# Patient Record
Sex: Female | Born: 1980 | Race: Asian | Hispanic: No | Marital: Single | Smoking: Current some day smoker
Health system: Southern US, Community
[De-identification: ages and names within clinical notes are randomized; demographics above are authoritative.]

## PROBLEM LIST (undated history)

## (undated) DIAGNOSIS — I10 Essential (primary) hypertension: Secondary | ICD-10-CM

## (undated) DIAGNOSIS — E785 Hyperlipidemia, unspecified: Secondary | ICD-10-CM

## (undated) HISTORY — PX: BREAST SURGERY: SHX581

## (undated) HISTORY — DX: Hyperlipidemia, unspecified: E78.5

## (undated) HISTORY — DX: Essential (primary) hypertension: I10

---

## 2009-05-28 ENCOUNTER — Emergency Department (HOSPITAL_COMMUNITY): Admission: EM | Admit: 2009-05-28 | Discharge: 2009-05-28 | Payer: Self-pay | Admitting: Emergency Medicine

## 2010-07-18 LAB — POCT I-STAT, CHEM 8
Hemoglobin: 15 g/dL (ref 12.0–15.0)
Sodium: 140 mEq/L (ref 135–145)
TCO2: 23 mmol/L (ref 0–100)

## 2010-07-18 LAB — CBC
HCT: 42.5 % (ref 36.0–46.0)
MCHC: 32.7 g/dL (ref 30.0–36.0)
MCV: 84.8 fL (ref 78.0–100.0)
Platelets: 328 10*3/uL (ref 150–400)
RDW: 13.3 % (ref 11.5–15.5)

## 2010-07-18 LAB — DIFFERENTIAL
Basophils Relative: 0 % (ref 0–1)
Eosinophils Absolute: 0 10*3/uL (ref 0.0–0.7)
Eosinophils Relative: 1 % (ref 0–5)
Neutrophils Relative %: 63 % (ref 43–77)

## 2010-07-18 LAB — POCT CARDIAC MARKERS: Myoglobin, poc: 55.9 ng/mL (ref 12–200)

## 2014-05-01 HISTORY — PX: AUGMENTATION MAMMAPLASTY: SUR837

## 2015-11-23 ENCOUNTER — Ambulatory Visit (INDEPENDENT_AMBULATORY_CARE_PROVIDER_SITE_OTHER): Payer: BLUE CROSS/BLUE SHIELD | Admitting: Physician Assistant

## 2015-11-23 VITALS — BP 122/72 | HR 97 | Temp 99.2°F | Resp 17 | Ht 66.0 in | Wt 179.0 lb

## 2015-11-23 DIAGNOSIS — N946 Dysmenorrhea, unspecified: Secondary | ICD-10-CM | POA: Diagnosis not present

## 2015-11-23 DIAGNOSIS — N939 Abnormal uterine and vaginal bleeding, unspecified: Secondary | ICD-10-CM | POA: Diagnosis not present

## 2015-11-23 LAB — POCT WET + KOH PREP
Trich by wet prep: ABSENT
YEAST BY WET PREP: ABSENT
Yeast by KOH: ABSENT

## 2015-11-23 LAB — POCT URINE PREGNANCY: PREG TEST UR: NEGATIVE

## 2015-11-23 MED ORDER — TRAMADOL HCL 50 MG PO TABS: 50.0000 mg | ORAL_TABLET | Freq: Three times a day (TID) | ORAL | 0 refills | Status: DC | PRN

## 2015-11-23 MED ORDER — NAPROXEN 500 MG PO TABS: 500.0000 mg | ORAL_TABLET | Freq: Two times a day (BID) | ORAL | 0 refills | Status: DC

## 2015-11-23 NOTE — Patient Instructions (Signed)
     IF you received an x-ray today, you will receive an invoice from [facility]. Please contact [facility] at [phone] with questions or concerns regarding your invoice.   IF you received labwork today, you will receive an invoice from [facility]. Please contact [facility] at [phone] with questions or concerns regarding your invoice.   Our billing staff will not be able to assist you with questions regarding bills from these companies.  You will be contacted with the lab results as soon as they are available. The fastest way to get your results is to activate your My Chart account. Instructions are located on the last page of this paperwork. If you have not heard from us regarding the results in 2 weeks, please contact this office.      

## 2015-11-23 NOTE — Progress Notes (Signed)
11/24/2015 9:29 AM   DOB: 1981/01/02 / MRN: [id number]  SUBJECTIVE:  Jordan Berger is a 35 y.o. female presenting for abnormal uterine bleeding and lower abdominal cramping that started after taking a Plan B roughly 1 month ago.  States she had some heavy bleeding just after taking plan B, then the bleeding lightened and she started taking [place] OCPs which her friend states was "high dose." The do not know the name of the medication.  Reports she continues to have bleeding and is going through 4 tampons daily.  She denies global symptoms of anemia and blood loss today.  She has GYN follow up in place for next Thursday. She has remained sexually active with this female since the onset of the bleeding.  He is a new partner for her.   She has No Known Allergies.   She  has a past medical history of Hyperlipidemia and Hypertension.    She  reports that she has been smoking Cigarettes.  She has a 1.25 pack-year smoking history. She has never used smokeless tobacco. She reports that she drinks alcohol. She  reports that she does not engage in sexual activity. The patient  has a past surgical history that includes Breast surgery.  Her family history includes Cancer in her mother; Heart disease in her father.  Review of Systems  Constitutional: Negative for chills and fever.  Gastrointestinal: Negative for constipation and diarrhea.  Genitourinary: Negative for dysuria, frequency and urgency.  Skin: Negative for itching and rash.  Neurological: Negative for dizziness and headaches.    Problem list and medications reviewed and updated by myself where necessary, and exist elsewhere in the encounter.   OBJECTIVE:  BP 122/72 (BP Location: Right Arm, Patient Position: Sitting, Cuff Size: Normal)   Pulse 97   Temp 99.2 F (37.3 C) (Oral)   Resp 17   Ht 5\' 6"  (1.676 m)   Wt 179 lb (81.2 kg)   LMP 11/23/2015 (Approximate)   SpO2 100%   BMI 28.89 kg/m   Physical Exam  Constitutional: She  is oriented to person, place, and time.  HENT:  Right Ear: External ear normal.  Left Ear: External ear normal.  Nose: Mucosal edema present. Right sinus exhibits no maxillary sinus tenderness and no frontal sinus tenderness. Left sinus exhibits no maxillary sinus tenderness and no frontal sinus tenderness.  Mouth/Throat: Oropharynx is clear and moist. No oropharyngeal exudate.  Eyes: Conjunctivae are normal. Pupils are equal, round, and reactive to light.  Cardiovascular: Regular rhythm and normal heart sounds.   Pulmonary/Chest: Effort normal and breath sounds normal. She has no rales.  Genitourinary: Uterus normal.    Cervix exhibits no motion tenderness, no discharge and no friability. Right adnexum displays no mass and no fullness. Left adnexum displays no mass and no fullness.    Neurological: She is alert and oriented to person, place, and time.  Skin: Skin is warm and dry. No rash noted. She is not diaphoretic. No erythema.  Psychiatric: Her behavior is normal.    Results for orders placed or performed in visit on 11/23/15 (from the past 72 hour(s))  POCT Wet + KOH Prep     Status: None   Collection Time: 11/23/15  6:25 PM  Result Value Ref Range   Yeast by KOH Absent Present, Absent   Yeast by wet prep Absent Present, Absent   WBC by wet prep None None, Few, Too numerous to count   Clue Cells Wet Prep HPF POC None None,  Too numerous to count   Trich by wet prep Absent Present, Absent   Bacteria Wet Prep HPF POC Few None, Few, Too numerous to count   Epithelial Cells By [employer] ([facility]) Few None, Few, Too numerous to count   RBC,UR,HPF,POC None None RBC/hpf  POCT urine pregnancy     Status: None   Collection Time: 11/23/15  6:25 PM  Result Value Ref Range   Preg Test, Ur Negative Negative    No results found.  ASSESSMENT AND PLAN  Bradee was seen today for abdominal pain.  Diagnoses and all orders for this visit:  Abnormal uterine bleeding (AUB): She has GYN  follow up place next in roughly 10 days.  Her cervix appears normal to me and there is no blood in the vaginal vault.  -     Cancel: GC/chlamydia probe amp, genital -     POCT Wet + KOH Prep -     POCT urine pregnancy -     Cancel: CBC -     CBC -     GC/Chlamydia Probe Amp  Menstrual cramps -     naproxen (NAPROSYN) 500 MG tablet; Take 1 tablet (500 mg total) by mouth 2 (two) times daily with a meal. -     traMADol (ULTRAM) 50 MG tablet; Take 1 tablet (50 mg total) by mouth every 8 (eight) hours as needed (For severe pain only.).    The patient was advised to call or return to clinic if she does not see an improvement in symptoms, or to seek the care of the closest emergency department if she worsens with the above plan.   Philis Fendt, MHS, PA-C [facility] 11/24/2015 9:29 AM

## 2015-11-24 ENCOUNTER — Telehealth: Payer: Self-pay

## 2015-11-24 LAB — CBC
HCT: 38.4 % (ref 35.0–45.0)
Hemoglobin: 12.2 g/dL (ref 11.7–15.5)
MCH: 26.7 pg — ABNORMAL LOW (ref 27.0–33.0)
MCHC: 31.8 g/dL — ABNORMAL LOW (ref 32.0–36.0)
MCV: 84 fL (ref 80.0–100.0)
MPV: 9.3 fL (ref 7.5–12.5)
PLATELETS: 413 10*3/uL — AB (ref 140–400)
RBC: 4.57 MIL/uL (ref 3.80–5.10)
RDW: 13.1 % (ref 11.0–15.0)
WBC: 8.8 10*3/uL (ref 3.8–10.8)

## 2015-11-24 NOTE — Telephone Encounter (Signed)
PATIENT SAW MICHAEL CLARK YESTERDAY AND HE GAVE HER 2 PRESCRIPTIONS. SHE ONLY GOT ONE. SHE RECEIVED THE NAPROSYN, BUT SHE DID NOT GET THE PRINTED PRESCRIPTION FOR TRAMADOL. ALSO, SOME ONE CALLED HER ABOUT 6:30 LAST NIGHT BUT THEY DID NOT LEAVE A MESSAGE. SHE THINKS IT MAY BE REGARDING HER LAB RESULTS. BEST PHONE [phone] (CELL) PATIENT REQUEST WE LEAVE A MESSAGE ON HER VOICE MAIL BECAUSE SHE WILL BE AT WORK ALL DAY. [place]

## 2015-11-24 NOTE — Telephone Encounter (Signed)
rx faxed to pharmacy yesterday.  rx is ready for pickup.  Pt notified

## 2015-11-27 LAB — GC/CHLAMYDIA PROBE AMP
CT Probe RNA: NOT DETECTED
GC PROBE AMP APTIMA: NOT DETECTED

## 2015-12-01 ENCOUNTER — Encounter: Payer: Self-pay | Admitting: *Deleted

## 2016-07-12 ENCOUNTER — Ambulatory Visit (INDEPENDENT_AMBULATORY_CARE_PROVIDER_SITE_OTHER): Payer: BLUE CROSS/BLUE SHIELD | Admitting: Family Medicine

## 2016-07-12 VITALS — BP 128/76 | HR 74 | Temp 98.1°F | Resp 16 | Ht 66.0 in | Wt 172.0 lb

## 2016-07-12 DIAGNOSIS — R6889 Other general symptoms and signs: Secondary | ICD-10-CM | POA: Diagnosis not present

## 2016-07-12 LAB — POCT INFLUENZA A/B
INFLUENZA A, POC: NEGATIVE
INFLUENZA B, POC: NEGATIVE

## 2016-07-12 MED ORDER — FLUTICASONE PROPIONATE 50 MCG/ACT NA SUSP: 2.0000 | Freq: Every day | NASAL | 6 refills | Status: DC

## 2016-07-12 MED ORDER — FEXOFENADINE-PSEUDOEPHED ER 60-120 MG PO TB12: 1.0000 | ORAL_TABLET | Freq: Two times a day (BID) | ORAL | 1 refills | Status: DC

## 2016-07-12 NOTE — Progress Notes (Signed)
   Jordan Berger is a 36 y.o. female who presents to Primary Care at [facility] today for URI and flu like symptoms:    1.  Flu like symptoms:  Present since Saturday.  She initially started with runny nose. This progressed to sore throat and hoarseness for the next 3 or 2. Also with numbers of cough. She describes drainage is clear. She has had subjective fevers and chills at home. No myalgias. Cough is her worst symptom. She has trouble speaking anything lateral that was perhaps secondary to her hoarseness.  She's had no chest pain. No dyspnea on exertion. No extremity edema. She has had no sick contacts that she knows of.  ROS as above.    PMH reviewed. Patient is a nonsmoker.   Past Medical History:  Diagnosis Date  . Hyperlipidemia   . Hypertension    Past Surgical History:  Procedure Laterality Date  . BREAST SURGERY      Medications reviewed. Current Outpatient Prescriptions  Medication Sig Dispense Refill  . naproxen (NAPROSYN) 500 MG tablet Take 1 tablet (500 mg total) by mouth 2 (two) times daily with a meal. (Patient not taking: Reported on 07/12/2016) 30 tablet 0  . traMADol (ULTRAM) 50 MG tablet Take 1 tablet (50 mg total) by mouth every 8 (eight) hours as needed (For severe pain only.). (Patient not taking: Reported on 07/12/2016) 30 tablet 0   No current facility-administered medications for this visit.      Physical Exam:  BP 128/76   Pulse 74   Temp 98.1 F (36.7 C) (Oral)   Resp 16   Ht 5\' 6"  (1.676 m)   Wt 172 lb (78 kg)   SpO2 100%   BMI 27.76 kg/m  Gen:  Patient sitting on exam table, appears stated age in no acute distress Head: Normocephalic atraumatic Eyes: EOMI, PERRL, sclera and conjunctiva non-erythematous Ears:  Canals clear bilaterally.  TMs pearly gray bilaterally without erythema or bulging.   Nose:  Nasal turbinates grossly enlarged bilaterally with some clear exudates.  Mouth: Mucosa membranes moist. Tonsils +2, nonenlarged,  non-erythematous. Neck: No cervical lymphadenopathy noted Heart:  RRR, no murmurs auscultated. Pulm:  Clear to auscultation bilaterally with good air movement.  No wheezes or rales noted.     Results for orders placed or performed in visit on 07/12/16  POCT Influenza A/B  Result Value Ref Range   Influenza A, POC Negative Negative   Influenza B, POC Negative Negative    Assessment and Plan:  1.  Upper respiratory infection:  - Likely viral illness based on symptoms and history.  - negative flu here.  No signs of bacterial illness. Symptomatic treatment for now, see instructions. Return if worsening or no improvement in 1 week.   2.  Hoarseness: - secondary to #1 above.   - rest and hydration  - can persist for 5 - 7 days.   - reassurance provided.

## 2016-07-12 NOTE — Patient Instructions (Addendum)
Your flu test was negative.  You have an upper respiratory infection. This should run its course and better in the next 3-4 days. The drainage from your sinuses and nose is dripping back onto your throat and vocal cords and this is what is causing your hoarseness.  We'll need to do is try to dry up your nasal passages. This is both the nasal spray as well as the decongestant which I have sent in for you.  The hoarseness of your voice will come back within the next 5-7 days. The best treatment for this is rest.  If you're not better in the next week come back and see [place]. If you start noticing any worse this come back sooner.    IF you received an x-ray today, you will receive an invoice from [facility]. Please contact [facility] at [phone] with questions or concerns regarding your invoice.   IF you received labwork today, you will receive an invoice from [facility]. Please contact [facility] at [phone] with questions or concerns regarding your invoice.   Our billing staff will not be able to assist you with questions regarding bills from these companies.  You will be contacted with the lab results as soon as they are available. The fastest way to get your results is to activate your My Chart account. Instructions are located on the last page of this paperwork. If you have not heard from [place] regarding the results in 2 weeks, please contact this office.

## 2019-02-19 ENCOUNTER — Other Ambulatory Visit: Payer: Self-pay

## 2019-02-19 DIAGNOSIS — Z20822 Contact with and (suspected) exposure to covid-19: Secondary | ICD-10-CM

## 2019-02-20 LAB — NOVEL CORONAVIRUS, NAA: SARS-CoV-2, NAA: NOT DETECTED

## 2019-07-10 ENCOUNTER — Ambulatory Visit: Payer: Self-pay | Attending: Internal Medicine

## 2019-07-10 DIAGNOSIS — Z23 Encounter for immunization: Secondary | ICD-10-CM

## 2019-07-10 NOTE — Progress Notes (Signed)
   Covid-19 Vaccination Clinic  Name:  Jordan Berger    MRN: [id number] DOB: [birth year]  07/10/2019  [name] was observed post Covid-19 immunization for 15 minutes without incident. She was provided with Vaccine Information Sheet and instruction to access the V-Safe system.   [name] was instructed to call 911 with any severe reactions post vaccine: Marland Kitchen Difficulty breathing  . Swelling of face and throat  . A fast heartbeat  . A bad rash all over body  . Dizziness and weakness   Immunizations Administered    Name Date Dose VIS Date Route   Pfizer COVID-19 Vaccine 07/10/2019 11:06 AM 0.3 mL 04/11/2019 Intramuscular   Manufacturer: [place]   Lot: [id number]   [place]: [id number]

## 2019-08-05 ENCOUNTER — Ambulatory Visit: Payer: Self-pay

## 2019-08-05 ENCOUNTER — Ambulatory Visit: Payer: Self-pay | Attending: Internal Medicine

## 2019-08-05 DIAGNOSIS — Z23 Encounter for immunization: Secondary | ICD-10-CM

## 2019-08-05 NOTE — Progress Notes (Signed)
   Covid-19 Vaccination Clinic  Name:  Jordan Berger    MRN: [id number] DOB: 1980/09/13  08/05/2019  Jordan Berger was observed post Covid-19 immunization for 15 minutes without incident. She was provided with Vaccine Information Sheet and instruction to access the V-Safe system.   Jordan Berger was instructed to call 911 with any severe reactions post vaccine: Marland Kitchen Difficulty breathing  . Swelling of face and throat  . A fast heartbeat  . A bad rash all over body  . Dizziness and weakness   Immunizations Administered    Name Date Dose VIS Date Route   Pfizer COVID-19 Vaccine 08/05/2019  9:53 AM 0.3 mL 04/11/2019 Intramuscular   Manufacturer: [place]   Lot: [id number]   [place]: [id number]

## 2020-06-09 ENCOUNTER — Ambulatory Visit (INDEPENDENT_AMBULATORY_CARE_PROVIDER_SITE_OTHER): Payer: BLUE CROSS/BLUE SHIELD | Admitting: Family Medicine

## 2020-06-09 ENCOUNTER — Other Ambulatory Visit: Payer: Self-pay

## 2020-06-09 ENCOUNTER — Encounter: Payer: Self-pay | Admitting: Family Medicine

## 2020-06-09 ENCOUNTER — Other Ambulatory Visit (HOSPITAL_COMMUNITY)
Admission: RE | Admit: 2020-06-09 | Discharge: 2020-06-09 | Disposition: A | Discharge status: Home / Self Care | Payer: BLUE CROSS/BLUE SHIELD | Source: Ambulatory Visit | Attending: Family Medicine | Admitting: Family Medicine

## 2020-06-09 VITALS — BP 109/73 | HR 74 | Temp 98.2°F | Ht 67.0 in | Wt 170.0 lb

## 2020-06-09 DIAGNOSIS — Z1239 Encounter for other screening for malignant neoplasm of breast: Secondary | ICD-10-CM | POA: Diagnosis not present

## 2020-06-09 DIAGNOSIS — Z0000 Encounter for general adult medical examination without abnormal findings: Secondary | ICD-10-CM

## 2020-06-09 DIAGNOSIS — Z113 Encounter for screening for infections with a predominantly sexual mode of transmission: Secondary | ICD-10-CM | POA: Diagnosis not present

## 2020-06-09 DIAGNOSIS — Z124 Encounter for screening for malignant neoplasm of cervix: Secondary | ICD-10-CM | POA: Insufficient documentation

## 2020-06-09 DIAGNOSIS — Z23 Encounter for immunization: Secondary | ICD-10-CM

## 2020-06-09 DIAGNOSIS — Z7689 Persons encountering health services in other specified circumstances: Secondary | ICD-10-CM

## 2020-06-09 DIAGNOSIS — Z01419 Encounter for gynecological examination (general) (routine) without abnormal findings: Secondary | ICD-10-CM | POA: Diagnosis not present

## 2020-06-09 DIAGNOSIS — Z9889 Other specified postprocedural states: Secondary | ICD-10-CM

## 2020-06-09 NOTE — Progress Notes (Signed)
New Patient Office Visit  Subjective:  Patient ID: Jordan Berger, female    DOB: [birth year]  Age: 40 y.o. MRN: [id number]  CC:  Chief Complaint  Patient presents with  . Establish Care    HPI Jordan Berger presents to establish care and for a physical with pap. Her last pap was 3 years ago. She denies a history of abnormal paps. She is doing well and has no new concerns today.   Past Medical History:  Diagnosis Date  . Hyperlipidemia   . Hypertension     Past Surgical History:  Procedure Laterality Date  . BREAST SURGERY      Family History  Problem Relation Age of Onset  . Cancer Mother   . Heart disease Father     Social History   Socioeconomic History  . Marital status: Single    Spouse name: Not on file  . Number of children: Not on file  . Years of education: Not on file  . Highest education level: Bachelor's degree (e.g., BA, AB, BS)  Occupational History  . Not on file  Tobacco Use  . Smoking status: Current Some Day Smoker    Packs/day: 0.25    Years: 5.00    Pack years: 1.25    Types: Cigarettes  . Smokeless tobacco: Never Used  Vaping Use  . Vaping Use: Never used  Substance and Sexual Activity  . Alcohol use: Never  . Drug use: Never  . Sexual activity: Yes    Birth control/protection: None  Other Topics Concern  . Not on file  Social History Narrative  . Not on file   Social Determinants of Health   Financial Resource Strain: Not on file  Food Insecurity: Not on file  Transportation Needs: Not on file  Physical Activity: Not on file  Stress: Not on file  Social Connections: Not on file  Intimate Partner Violence: Not on file    ROS Review of Systems Negative unless specially indicated above in HPI.  Objective:   Today's Vitals: BP 109/73   Pulse 74   Temp 98.2 F (36.8 C)   Ht '5\' 7"'  (1.702 m)   Wt 170 lb (77.1 kg)   LMP 05/23/2020   SpO2 100%   BMI 26.63 kg/m   Physical Exam Vitals and nursing note  reviewed. Exam conducted with a chaperone present.  Constitutional:      General: She is not in acute distress.    Appearance: Normal appearance. She is not ill-appearing.  HENT:     Head: Normocephalic.     Right Ear: Tympanic membrane, ear canal and external ear normal.     Left Ear: Tympanic membrane, ear canal and external ear normal.     Nose: Nose normal.     Mouth/Throat:     Mouth: Mucous membranes are dry.     Pharynx: Oropharynx is clear.  Eyes:     Extraocular Movements: Extraocular movements intact.     Conjunctiva/sclera: Conjunctivae normal.     Pupils: Pupils are equal, round, and reactive to light.  Cardiovascular:     Rate and Rhythm: Normal rate and regular rhythm.     Pulses: Normal pulses.     Heart sounds: Normal heart sounds. No murmur heard. No friction rub. No gallop.   Pulmonary:     Effort: Pulmonary effort is normal.     Breath sounds: Normal breath sounds.  Chest:  Breasts:     Right: No swelling, inverted nipple, mass, nipple discharge,  skin change or tenderness.     Left: No swelling, inverted nipple, mass, nipple discharge, skin change or tenderness.      Comments: Bilateral breast augmentation Abdominal:     General: Bowel sounds are normal. There is no distension.     Palpations: Abdomen is soft. There is no mass.     Tenderness: There is no abdominal tenderness. There is no guarding.  Genitourinary:    General: Normal vulva.     Exam position: Lithotomy position.     Pubic Area: No rash.      Labia:        Right: No rash, tenderness or lesion.        Left: No rash, tenderness or lesion.      Vagina: Normal.     Cervix: No cervical motion tenderness, discharge, friability, lesion, erythema or cervical bleeding.     Uterus: Normal.      Adnexa: Right adnexa normal and left adnexa normal.  Musculoskeletal:        General: No swelling or tenderness. Normal range of motion.     Cervical back: Normal range of motion and neck supple. No  tenderness.     Right lower leg: No edema.     Left lower leg: No edema.  Skin:    General: Skin is warm and dry.     Capillary Refill: Capillary refill takes less than 2 seconds.     Findings: No lesion or rash.  Neurological:     General: No focal deficit present.     Mental Status: She is alert and oriented to person, place, and time.     Cranial Nerves: No cranial nerve deficit.     Motor: No weakness.     Gait: Gait normal.  Psychiatric:        Mood and Affect: Mood normal.        Behavior: Behavior normal.        Thought Content: Thought content normal.        Judgment: Judgment normal.     Assessment & Plan:   Jalayne was seen today for establish care and gynecologic exam.  Diagnoses and all orders for this visit:  Encounter for well adult exam without abnormal findings Labs pending as below. She did eat a doughnut about 1.5 hours before labs, however she preferred to have them done today.  -     CBC with Differential/Platelet -     CMP14+EGFR -     Lipid panel -     TSH  Encounter for gynecological examination/Screening for malignant neoplasm of cervix -     Cytology - PAP  Breast screening/History of augmentation of both breasts History of breast augmentation. Previous mammograms completed in [place]. Review of records from general surgeon last year shows that a mammogram was ordered, however I do not believe it was completed.  -     MM Digital Screening; Future  Need for Tdap vaccination Vaccine today in office.  -     Tdap vaccine greater than or equal to 7yo IM  Encounter to establish care Reviewed available records in Epic.    Follow-up: Return in about 1 year (around 06/09/2021) for CPE.   The patient indicates understanding of these issues and agrees with the plan.  Jordan Perking, FNP

## 2020-06-09 NOTE — Patient Instructions (Signed)
Preventive Care 11-40 Years Old, Female Preventive care refers to lifestyle choices and visits with your health care provider that can promote health and wellness. This includes:  A yearly physical exam. This is also called an annual wellness visit.  Regular dental and eye exams.  Immunizations.  Screening for certain conditions.  Healthy lifestyle choices, such as: ? Eating a healthy diet. ? Getting regular exercise. ? Not using drugs or products that contain nicotine and tobacco. ? Limiting alcohol use. What can I expect for my preventive care visit? Physical exam Your health care provider may check your:  Height and weight. These may be used to calculate your BMI (body mass index). BMI is a measurement that tells if you are at a healthy weight.  Heart rate and blood pressure.  Body temperature.  Skin for abnormal spots. Counseling Your health care provider may ask you questions about your:  Past medical problems.  Family's medical history.  Alcohol, tobacco, and drug use.  Emotional well-being.  Home life and relationship well-being.  Sexual activity.  Diet, exercise, and sleep habits.  Work and work Statistician.  Access to firearms.  Method of birth control.  Menstrual cycle.  Pregnancy history. What immunizations do I need? Vaccines are usually given at various ages, according to a schedule. Your health care provider will recommend vaccines for you based on your age, medical history, and lifestyle or other factors, such as travel or where you work.   What tests do I need? Blood tests  Lipid and cholesterol levels. These may be checked every 5 years starting at age 64.  Hepatitis C test.  Hepatitis B test. Screening  Diabetes screening. This is done by checking your blood sugar (glucose) after you have not eaten for a while (fasting).  STD (sexually transmitted disease) testing, if you are at risk.  BRCA-related cancer screening. This may  be done if you have a family history of breast, ovarian, tubal, or peritoneal cancers.  Pelvic exam and Pap test. This may be done every 3 years starting at age 61. Starting at age 82, this may be done every 5 years if you have a Pap test in combination with an HPV test. Talk with your health care provider about your test results, treatment options, and if necessary, the need for more tests.   Follow these instructions at home: Eating and drinking  Eat a healthy diet that includes fresh fruits and vegetables, whole grains, lean protein, and low-fat dairy products.  Take vitamin and mineral supplements as recommended by your health care provider.  Do not drink alcohol if: ? Your health care provider tells you not to drink. ? You are pregnant, may be pregnant, or are planning to become pregnant.  If you drink alcohol: ? Limit how much you have to 0-1 drink a day. ? Be aware of how much alcohol is in your drink. In the [place] one drink equals one 12 oz bottle of beer (355 mL), one 5 oz glass of wine (148 mL), or one 1 oz glass of hard liquor (44 mL).   Lifestyle  Take daily care of your teeth and gums. Brush your teeth every morning and night with fluoride toothpaste. Floss one time each day.  Stay active. Exercise for at least 30 minutes 5 or more days each week.  Do not use any products that contain nicotine or tobacco, such as cigarettes, e-cigarettes, and chewing tobacco. If you need help quitting, ask your health care provider.  Do  not use drugs.  If you are sexually active, practice safe sex. Use a condom or other form of protection to prevent STIs (sexually transmitted infections).  If you do not wish to become pregnant, use a form of birth control. If you plan to become pregnant, see your health care provider for a prepregnancy visit.  Find healthy ways to cope with stress, such as: ? Meditation, yoga, or listening to music. ? Journaling. ? Talking to a trusted  person. ? Spending time with friends and family. Safety  Always wear your seat belt while driving or riding in a vehicle.  Do not drive: ? If you have been drinking alcohol. Do not ride with someone who has been drinking. ? When you are tired or distracted. ? While texting.  Wear a helmet and other protective equipment during sports activities.  If you have firearms in your house, make sure you follow all gun safety procedures.  Seek help if you have been physically or sexually abused. What's next?  Go to your health care provider once a year for an annual wellness visit.  Ask your health care provider how often you should have your eyes and teeth checked.  Stay up to date on all vaccines. This information is not intended to replace advice given to you by your health care provider. Make sure you discuss any questions you have with your health care provider. Document Revised: 12/14/2019 Document Reviewed: 12/27/2017 Elsevier Patient Education  2021 [employer].

## 2020-06-10 LAB — CMP14+EGFR
ALT: 14 IU/L (ref 0–32)
AST: 13 IU/L (ref 0–40)
Albumin/Globulin Ratio: 1.8 (ref 1.2–2.2)
Albumin: 4.6 g/dL (ref 3.8–4.8)
Alkaline Phosphatase: 53 IU/L (ref 44–121)
BUN/Creatinine Ratio: 21 (ref 9–23)
BUN: 15 mg/dL (ref 6–20)
Bilirubin Total: 0.3 mg/dL (ref 0.0–1.2)
CO2: 20 mmol/L (ref 20–29)
Calcium: 9.6 mg/dL (ref 8.7–10.2)
Chloride: 104 mmol/L (ref 96–106)
Creatinine, Ser: 0.72 mg/dL (ref 0.57–1.00)
GFR calc Af Amer: 122 mL/min/{1.73_m2} (ref >59)
GFR calc non Af Amer: 106 mL/min/{1.73_m2} (ref >59)
Globulin, Total: 2.5 g/dL (ref 1.5–4.5)
Glucose: 79 mg/dL (ref 65–99)
Potassium: 4.5 mmol/L (ref 3.5–5.2)
Sodium: 139 mmol/L (ref 134–144)
Total Protein: 7.1 g/dL (ref 6.0–8.5)

## 2020-06-10 LAB — CBC WITH DIFFERENTIAL/PLATELET
Basophils Absolute: 0 10*3/uL (ref 0.0–0.2)
Basos: 1 %
EOS (ABSOLUTE): 0.1 10*3/uL (ref 0.0–0.4)
Eos: 1 %
Hematocrit: 37.1 % (ref 34.0–46.6)
Hemoglobin: 12 g/dL (ref 11.1–15.9)
Immature Grans (Abs): 0 10*3/uL (ref 0.0–0.1)
Immature Granulocytes: 0 %
Lymphocytes Absolute: 2.3 10*3/uL (ref 0.7–3.1)
Lymphs: 38 %
MCH: 26.6 pg (ref 26.6–33.0)
MCHC: 32.3 g/dL (ref 31.5–35.7)
MCV: 82 fL (ref 79–97)
Monocytes Absolute: 0.5 10*3/uL (ref 0.1–0.9)
Monocytes: 9 %
Neutrophils Absolute: 3.1 10*3/uL (ref 1.4–7.0)
Neutrophils: 51 %
Platelets: 367 10*3/uL (ref 150–450)
RBC: 4.51 x10E6/uL (ref 3.77–5.28)
RDW: 12.8 % (ref 11.7–15.4)
WBC: 6 10*3/uL (ref 3.4–10.8)

## 2020-06-10 LAB — TSH: TSH: 2.41 u[IU]/mL (ref 0.450–4.500)

## 2020-06-10 LAB — LIPID PANEL
Chol/HDL Ratio: 2.8 ratio (ref 0.0–4.4)
Cholesterol, Total: 192 mg/dL (ref 100–199)
HDL: 69 mg/dL (ref >39)
LDL Chol Calc (NIH): 114 mg/dL — ABNORMAL HIGH (ref 0–99)
Triglycerides: 49 mg/dL (ref 0–149)
VLDL Cholesterol Cal: 9 mg/dL (ref 5–40)

## 2020-06-11 LAB — CYTOLOGY - PAP
Adequacy: ABSENT
Chlamydia: NEGATIVE
Comment: NEGATIVE
Comment: NEGATIVE
Comment: NORMAL
Diagnosis: NEGATIVE
Neisseria Gonorrhea: NEGATIVE
Trichomonas: NEGATIVE

## 2020-06-23 ENCOUNTER — Other Ambulatory Visit: Payer: Self-pay

## 2020-06-23 ENCOUNTER — Other Ambulatory Visit: Payer: Self-pay | Admitting: Family Medicine

## 2020-06-23 ENCOUNTER — Ambulatory Visit
Admission: RE | Admit: 2020-06-23 | Discharge: 2020-06-23 | Disposition: A | Discharge status: Home / Self Care | Payer: BLUE CROSS/BLUE SHIELD | Source: Ambulatory Visit | Attending: Family Medicine | Admitting: Family Medicine

## 2020-06-23 DIAGNOSIS — Z1239 Encounter for other screening for malignant neoplasm of breast: Secondary | ICD-10-CM

## 2020-06-23 DIAGNOSIS — Z9889 Other specified postprocedural states: Secondary | ICD-10-CM

## 2021-01-25 ENCOUNTER — Encounter: Payer: Self-pay | Admitting: Family Medicine

## 2021-01-25 ENCOUNTER — Ambulatory Visit (INDEPENDENT_AMBULATORY_CARE_PROVIDER_SITE_OTHER): Payer: BLUE CROSS/BLUE SHIELD | Admitting: Family Medicine

## 2021-01-25 ENCOUNTER — Telehealth: Payer: Self-pay | Admitting: Family Medicine

## 2021-01-25 DIAGNOSIS — U071 COVID-19: Secondary | ICD-10-CM

## 2021-01-25 DIAGNOSIS — J069 Acute upper respiratory infection, unspecified: Secondary | ICD-10-CM

## 2021-01-25 MED ORDER — PSEUDOEPH-BROMPHEN-DM 30-2-10 MG/5ML PO SYRP: 5.0000 mL | ORAL_SOLUTION | Freq: Four times a day (QID) | ORAL | 0 refills | Status: DC | PRN

## 2021-01-25 MED ORDER — FLUTICASONE PROPIONATE 50 MCG/ACT NA SUSP
2.0000 | Freq: Every day | NASAL | 6 refills | Status: DC
Start: 2021-01-25 — End: 2021-01-25

## 2021-01-25 MED ORDER — FLUTICASONE PROPIONATE 50 MCG/ACT NA SUSP: 2.0000 | Freq: Every day | NASAL | 6 refills | Status: DC

## 2021-01-25 NOTE — Progress Notes (Signed)
Virtual Visit via MyChart Video Note Due to COVID-19 pandemic this visit was conducted virtually. This visit type was conducted due to national recommendations for restrictions regarding the COVID-19 Pandemic (e.g. social distancing, sheltering in place) in an effort to limit this patient's exposure and mitigate transmission in our community. All issues noted in this document were discussed and addressed.  A physical exam was not performed with this format.   I connected with Jordan [facility] on 01/25/2021 at 1310 by MyChart Video and verified that I am speaking with the correct person using two identifiers. [facility] is currently located at [facility] and  no one  is currently with them during visit. The provider, Monia Pouch, FNP is located in their office at time of visit.  I discussed the limitations, risks, security and privacy concerns of performing an evaluation and management service by telephone and the availability of in person appointments. I also discussed with the patient that there may be a patient responsible charge related to this service. The patient expressed understanding and agreed to proceed.  Subjective:  Patient ID: Jordan [facility], female    DOB: [birth year], 40 y.o.   MRN: [id number]  Chief Complaint:  Covid Positive and Cough   HPI: Jordan [facility] is a 40 y.o. female presenting on 01/25/2021 for Covid Positive and Cough   Pt reports she tested positive for COVID-19 on Sunday. States she continues to have headache, sinus pressure, difficulty smelling, cough, congestion, and malaise.   Cough This is a new problem. The current episode started in the past 7 days. The problem has been waxing and waning. The problem occurs every few minutes. The cough is Non-productive. Associated symptoms include headaches, nasal congestion, postnasal drip and rhinorrhea. Pertinent negatives include no chest pain, chills, ear congestion, ear pain, fever, heartburn, hemoptysis,  myalgias, rash, sore throat, shortness of breath, sweats, weight loss or wheezing. Nothing aggravates the symptoms. Treatments tried: Mucinex. The treatment provided mild relief.    Relevant past medical, surgical, family, and social history reviewed and updated as indicated.  Allergies and medications reviewed and updated.   Past Medical History:  Diagnosis Date   Hyperlipidemia    Hypertension     Past Surgical History:  Procedure Laterality Date   AUGMENTATION MAMMAPLASTY Bilateral 2016   BREAST SURGERY      Social History   Socioeconomic History   Marital status: Single    Spouse name: Not on file   Number of children: Not on file   Years of education: Not on file   Highest education level: Bachelor's degree (e.g., BA, AB, BS)  Occupational History   Not on file  Tobacco Use   Smoking status: Some Days    Packs/day: 0.25    Years: 5.00    Pack years: 1.25    Types: Cigarettes   Smokeless tobacco: Never  Vaping Use   Vaping Use: Never used  Substance and Sexual Activity   Alcohol use: Never   Drug use: Never   Sexual activity: Yes    Birth control/protection: None  Other Topics Concern   Not on file  Social History Narrative   Not on file   Social Determinants of Health   Financial Resource Strain: Not on file  Food Insecurity: Not on file  Transportation Needs: Not on file  Physical Activity: Not on file  Stress: Not on file  Social Connections: Not on file  Intimate Partner Violence: Not on file    Outpatient Encounter Medications as of 01/25/2021  Medication Sig   brompheniramine-pseudoephedrine-DM 30-2-10 MG/5ML syrup Take 5 mLs by mouth 4 (four) times daily as needed.   fluticasone (FLONASE) 50 MCG/ACT nasal spray Place 2 sprays into both nostrils daily.   No facility-administered encounter medications on file as of 01/25/2021.    No Known Allergies  Review of Systems  Constitutional:  Positive for activity change and fatigue. Negative for  appetite change, chills, diaphoresis, fever, unexpected weight change and weight loss.  HENT:  Positive for congestion, postnasal drip, rhinorrhea, sinus pressure and sinus pain. Negative for dental problem, drooling, ear discharge, ear pain, facial swelling, hearing loss, mouth sores, nosebleeds, sneezing, sore throat, tinnitus, trouble swallowing and voice change.   Eyes: Negative.   Respiratory:  Positive for cough. Negative for hemoptysis, chest tightness, shortness of breath and wheezing.   Cardiovascular:  Negative for chest pain, palpitations and leg swelling.  Gastrointestinal:  Negative for abdominal pain, blood in stool, constipation, diarrhea, heartburn, nausea and vomiting.  Endocrine: Negative.   Genitourinary:  Negative for decreased urine volume, difficulty urinating, dysuria, frequency and urgency.  Musculoskeletal:  Negative for arthralgias and myalgias.  Skin: Negative.  Negative for rash.  Allergic/Immunologic: Negative.   Neurological:  Positive for headaches. Negative for dizziness, tremors, seizures, syncope, facial asymmetry, speech difficulty, weakness, light-headedness and numbness.  Hematological: Negative.   Psychiatric/Behavioral:  Negative for confusion, hallucinations, sleep disturbance and suicidal ideas.   All other systems reviewed and are negative.       Observations/Objective: No vital signs or physical exam, this was a telephone or virtual health encounter.  Pt alert and oriented, answers all questions appropriately, and able to speak in full sentences.  No acute distress noted during MyChart Video visit. No tachypnea or cyanosis noted. No cough noted.   Assessment and Plan: Jordan [facility] was seen today for covid positive and cough.  Diagnoses and all orders for this visit:  COVID-19 virus infection URI with cough and congestion Initial symptoms more than 5 days ago. Encouraged to increase water intake to help thin secretions/sputum. Will add Flonase and  Bromfed to regimen. Pt requesting antibiotic therapy. Provided education on proper use of antibiotics. Aware COVID-19 is a viral illness and antibiotics are not warranted. Aware if symptoms persist past 14 days to follow up.  -     fluticasone (FLONASE) 50 MCG/ACT nasal spray; Place 2 sprays into both nostrils daily. -     brompheniramine-pseudoephedrine-DM 30-2-10 MG/5ML syrup; Take 5 mLs by mouth 4 (four) times daily as needed.     Follow Up Instructions: Return if symptoms worsen or fail to improve.    I discussed the assessment and treatment plan with the patient. The patient was provided an opportunity to ask questions and all were answered. The patient agreed with the plan and demonstrated an understanding of the instructions.   The patient was advised to call back or seek an in-person evaluation if the symptoms worsen or if the condition fails to improve as anticipated.  The above assessment and management plan was discussed with the patient. The patient verbalized understanding of and has agreed to the management plan. Patient is aware to call the clinic if they develop any new symptoms or if symptoms persist or worsen. Patient is aware when to return to the clinic for a follow-up visit. Patient educated on when it is appropriate to go to the emergency department.    I provided 13 minutes of MyChart Video-face-to-face time during this encounter. The video started at 1310. The video ended  at 1323. The other time was used for coordination of care.    Monia Pouch, FNP-C [facility] [address] [place], [place] [address] [phone] 01/25/2021

## 2021-01-25 NOTE — Addendum Note (Signed)
Addended by: Zannie Cove on: 01/25/2021 02:06 PM   Modules accepted: Orders

## 2021-01-26 ENCOUNTER — Other Ambulatory Visit: Payer: Self-pay | Admitting: *Deleted

## 2021-01-26 DIAGNOSIS — U071 COVID-19: Secondary | ICD-10-CM

## 2021-01-26 DIAGNOSIS — J069 Acute upper respiratory infection, unspecified: Secondary | ICD-10-CM

## 2021-01-26 MED ORDER — FLUTICASONE PROPIONATE 50 MCG/ACT NA SUSP
2.0000 | Freq: Every day | NASAL | 6 refills | Status: DC
Start: 2021-01-26 — End: 2022-11-22

## 2021-01-26 MED ORDER — PSEUDOEPH-BROMPHEN-DM 30-2-10 MG/5ML PO SYRP: 5.0000 mL | ORAL_SOLUTION | Freq: Four times a day (QID) | ORAL | 0 refills | Status: DC | PRN

## 2021-01-26 NOTE — Telephone Encounter (Signed)
This was already taken care of.

## 2021-07-27 ENCOUNTER — Ambulatory Visit (INDEPENDENT_AMBULATORY_CARE_PROVIDER_SITE_OTHER): Payer: Self-pay | Admitting: Family Medicine

## 2021-07-27 ENCOUNTER — Encounter: Payer: Self-pay | Admitting: Family Medicine

## 2021-07-27 VITALS — BP 132/88 | HR 90 | Temp 97.6°F | Ht 67.0 in | Wt 164.0 lb

## 2021-07-27 DIAGNOSIS — J302 Other seasonal allergic rhinitis: Secondary | ICD-10-CM

## 2021-07-27 DIAGNOSIS — J358 Other chronic diseases of tonsils and adenoids: Secondary | ICD-10-CM

## 2021-07-27 MED ORDER — FLUTICASONE PROPIONATE 50 MCG/ACT NA SUSP: 2.0000 | Freq: Every day | NASAL | 6 refills | Status: DC

## 2021-07-27 MED ORDER — CETIRIZINE HCL 10 MG PO TABS: 10.0000 mg | ORAL_TABLET | Freq: Every day | ORAL | 0 refills | Status: DC

## 2021-07-27 NOTE — Progress Notes (Addendum)
?  ? ?Subjective:  ?Patient ID: Jordan [facility], female    DOB: May 27, 1980, 41 y.o.   MRN: [id number] ? ?Patient Care Team: ?Gwenlyn Perking, FNP as PCP - General (Family Medicine)  ? ?Chief Complaint:  Shortness of Breath (X 1 month on and off ) ? ? ?HPI: ?Jordan [facility] is a 41 y.o. female presenting on 07/27/2021 for Shortness of Breath (X 1 month on and off ) ? ? ?Pt presents with 1 month history of itchy throat, sinus pressure, shortness of breath, non productive cough. She reports history of tonsil stones. She is not frequently treated with antibiotics for strep throat.  ? ?Shortness of Breath ?This is a new problem. The current episode started in the past 7 days. The problem occurs rarely. The problem has been rapidly improving. Associated symptoms include coryza and a sore throat ("tickly"). Pertinent negatives include no abdominal pain, chest pain, claudication, ear pain, fever, headaches, hemoptysis, leg pain, leg swelling, neck pain, orthopnea, PND, rash, rhinorrhea, sputum production, swollen glands, syncope, vomiting or wheezing.  ? ? ? ?Relevant past medical, surgical, family, and social history reviewed and updated as indicated.  ?Allergies and medications reviewed and updated. Data reviewed: Chart in Epic. ? ? ?Past Medical History:  ?Diagnosis Date  ? Hyperlipidemia   ? Hypertension   ? ? ?Past Surgical History:  ?Procedure Laterality Date  ? AUGMENTATION MAMMAPLASTY Bilateral 2016  ? BREAST SURGERY    ? ? ?Social History  ? ?Socioeconomic History  ? Marital status: Single  ?  Spouse name: Not on file  ? Number of children: Not on file  ? Years of education: Not on file  ? Highest education level: Bachelor's degree (e.g., BA, AB, BS)  ?Occupational History  ? Not on file  ?Tobacco Use  ? Smoking status: Some Days  ?  Packs/day: 0.25  ?  Years: 5.00  ?  Pack years: 1.25  ?  Types: Cigarettes  ? Smokeless tobacco: Never  ?Vaping Use  ? Vaping Use: Never used  ?Substance and Sexual Activity  ?  Alcohol use: Never  ? Drug use: Never  ? Sexual activity: Yes  ?  Birth control/protection: None  ?Other Topics Concern  ? Not on file  ?Social History Narrative  ? Not on file  ? ?Social Determinants of Health  ? ?Financial Resource Strain: Not on file  ?Food Insecurity: Not on file  ?Transportation Needs: Not on file  ?Physical Activity: Not on file  ?Stress: Not on file  ?Social Connections: Not on file  ?Intimate Partner Violence: Not on file  ? ? ?Outpatient Encounter Medications as of 07/27/2021  ?Medication Sig  ? fluticasone (FLONASE) 50 MCG/ACT nasal spray Place 2 sprays into both nostrils daily.  ? [DISCONTINUED] brompheniramine-pseudoephedrine-DM 30-2-10 MG/5ML syrup Take 5 mLs by mouth 4 (four) times daily as needed.  ? ?No facility-administered encounter medications on file as of 07/27/2021.  ? ? ?No Known Allergies ? ?Review of Systems  ?Constitutional:  Negative for activity change, appetite change, chills, diaphoresis, fatigue, fever and unexpected weight change.  ?HENT:  Positive for sore throat ("tickly"). Negative for congestion, dental problem, drooling, ear discharge, ear pain, facial swelling, hearing loss, mouth sores, nosebleeds, postnasal drip, rhinorrhea, sinus pressure, sinus pain, sneezing, trouble swallowing and voice change.   ?Respiratory:  Positive for cough. Negative for hemoptysis, sputum production, shortness of breath and wheezing.   ?Cardiovascular:  Negative for chest pain, orthopnea, claudication, leg swelling, syncope and PND.  ?Gastrointestinal:  Negative for  abdominal pain and vomiting.  ?Genitourinary:  Negative for decreased urine volume and difficulty urinating.  ?Musculoskeletal:  Negative for neck pain.  ?Skin:  Negative for rash.  ?Neurological:  Negative for weakness and headaches.  ?All other systems reviewed and are negative. ? ?   ? ?Objective:  ?BP 132/88   Pulse 90   Temp 97.6 ?F (36.4 ?C) (Temporal)   Ht _0  (1.702 m)   Wt 74.4 kg   SpO2 100%   BMI 25.69  kg/m?   ? ?Wt Readings from Last 3 Encounters:  ?07/27/21 74.4 kg  ?06/09/20 77.1 kg  ?07/12/16 78 kg  ? ? ?Physical Exam ?Vitals and nursing note reviewed.  ?Constitutional:   ?   Appearance: Normal appearance. She is well-developed and normal weight.  ?HENT:  ?   Head: Normocephalic and atraumatic.  ?   Mouth/Throat:  ?   Mouth: Mucous membranes are moist.  ?   Pharynx: Oropharynx is clear.  ?   Tonsils: No tonsillar exudate or tonsillar abscesses.  ? ?   Comments: Cobblestoning of the throat noted ? ?Eyes:  ?   Extraocular Movements: Extraocular movements intact.  ?   Pupils: Pupils are equal, round, and reactive to light.  ?Cardiovascular:  ?   Rate and Rhythm: Normal rate and regular rhythm.  ?   Heart sounds: Normal heart sounds.  ?Pulmonary:  ?   Effort: Pulmonary effort is normal.  ?   Breath sounds: Normal breath sounds.  ?Musculoskeletal:     ?   General: Normal range of motion.  ?   Cervical back: Normal range of motion.  ?Skin: ?   General: Skin is warm and dry.  ?   Capillary Refill: Capillary refill takes less than 2 seconds.  ?Neurological:  ?   General: No focal deficit present.  ?   Mental Status: She is alert and oriented to person, place, and time.  ?Psychiatric:     ?   Mood and Affect: Mood normal.     ?   Behavior: Behavior normal.     ?   Thought Content: Thought content normal.     ?   Judgment: Judgment normal.  ? ? ?Results for orders placed or performed in visit on 06/09/20  ?CBC with Differential/Platelet  ?Result Value Ref Range  ? WBC 6.0 3.4 - 10.8 x10E3/uL  ? RBC 4.51 3.77 - 5.28 x10E6/uL  ? Hemoglobin 12.0 11.1 - 15.9 g/dL  ? Hematocrit 37.1 34.0 - 46.6 %  ? MCV 82 79 - 97 fL  ? MCH 26.6 26.6 - 33.0 pg  ? MCHC 32.3 31.5 - 35.7 g/dL  ? RDW 12.8 11.7 - 15.4 %  ? Platelets 367 150 - 450 x10E3/uL  ? Neutrophils 51 Not Estab. %  ? Lymphs 38 Not Estab. %  ? Monocytes 9 Not Estab. %  ? Eos 1 Not Estab. %  ? Basos 1 Not Estab. %  ? Neutrophils Absolute 3.1 1.4 - 7.0 x10E3/uL  ? Lymphocytes  Absolute 2.3 0.7 - 3.1 x10E3/uL  ? Monocytes Absolute 0.5 0.1 - 0.9 x10E3/uL  ? EOS (ABSOLUTE) 0.1 0.0 - 0.4 x10E3/uL  ? Basophils Absolute 0.0 0.0 - 0.2 x10E3/uL  ? Immature Granulocytes 0 Not Estab. %  ? Immature Grans (Abs) 0.0 0.0 - 0.1 x10E3/uL  ?CMP14+EGFR  ?Result Value Ref Range  ? Glucose 79 65 - 99 mg/dL  ? BUN 15 6 - 20 mg/dL  ? Creatinine, Ser 0.72 0.57 - 1.00  mg/dL  ? GFR calc non Af Amer 106 >59 mL/min/1.73  ? GFR calc Af Amer 122 >59 mL/min/1.73  ? BUN/Creatinine Ratio 21 9 - 23  ? Sodium 139 134 - 144 mmol/L  ? Potassium 4.5 3.5 - 5.2 mmol/L  ? Chloride 104 96 - 106 mmol/L  ? CO2 20 20 - 29 mmol/L  ? Calcium 9.6 8.7 - 10.2 mg/dL  ? Total Protein 7.1 6.0 - 8.5 g/dL  ? Albumin 4.6 3.8 - 4.8 g/dL  ? Globulin, Total 2.5 1.5 - 4.5 g/dL  ? Albumin/Globulin Ratio 1.8 1.2 - 2.2  ? Bilirubin Total 0.3 0.0 - 1.2 mg/dL  ? Alkaline Phosphatase 53 44 - 121 IU/L  ? AST 13 0 - 40 IU/L  ? ALT 14 0 - 32 IU/L  ?Lipid panel  ?Result Value Ref Range  ? Cholesterol, Total 192 100 - 199 mg/dL  ? Triglycerides 49 0 - 149 mg/dL  ? HDL 69 >39 mg/dL  ? VLDL Cholesterol Cal 9 5 - 40 mg/dL  ? LDL Chol Calc (NIH) 114 (H) 0 - 99 mg/dL  ? Chol/HDL Ratio 2.8 0.0 - 4.4 ratio  ?TSH  ?Result Value Ref Range  ? TSH 2.410 0.450 - 4.500 uIU/mL  ?Cytology - PAP  ?Result Value Ref Range  ? Trichomonas Negative   ? Chlamydia Negative   ? Neisseria Gonorrhea Negative   ? Adequacy    ?  Satisfactory for evaluation; transformation zone component ABSENT.  ? Diagnosis    ?  - Negative for intraepithelial lesion or malignancy (NILM)  ? Comment Normal Reference Range Trichomonas - Negative   ? Comment Normal Reference Ranger Chlamydia - Negative   ? Comment    ?  Normal Reference Range Neisseria Gonorrhea - Negative  ? ?   ? ?Pertinent labs & imaging results that were available during my care of the patient were reviewed by me and considered in my medical decision making. ? ?Assessment & Plan:  ?There are no diagnoses linked to this encounter.   ? ?Continue all other maintenance medications. ? ?Follow up plan: ?No follow-ups on file. ?Jordan [facility] was seen today for shortness of breath. ? ?Diagnoses and all orders for this visit: ? ?Seasonal allergies ?-Ton

## 2021-08-23 ENCOUNTER — Other Ambulatory Visit: Payer: Self-pay | Admitting: Family Medicine

## 2021-09-29 ENCOUNTER — Other Ambulatory Visit: Payer: Self-pay | Admitting: Family Medicine

## 2021-09-29 DIAGNOSIS — Z1231 Encounter for screening mammogram for malignant neoplasm of breast: Secondary | ICD-10-CM

## 2021-10-05 ENCOUNTER — Ambulatory Visit
Admission: RE | Admit: 2021-10-05 | Discharge: 2021-10-05 | Disposition: A | Discharge status: Home / Self Care | Payer: Self-pay | Source: Ambulatory Visit | Attending: Family Medicine | Admitting: Family Medicine

## 2021-10-05 ENCOUNTER — Other Ambulatory Visit: Payer: Self-pay | Admitting: Family Medicine

## 2021-10-05 ENCOUNTER — Ambulatory Visit (INDEPENDENT_AMBULATORY_CARE_PROVIDER_SITE_OTHER): Payer: Self-pay | Admitting: Family Medicine

## 2021-10-05 ENCOUNTER — Encounter: Payer: Self-pay | Admitting: Family Medicine

## 2021-10-05 VITALS — BP 115/79 | HR 97 | Temp 98.3°F | Ht 67.0 in | Wt 164.2 lb

## 2021-10-05 DIAGNOSIS — Z0000 Encounter for general adult medical examination without abnormal findings: Secondary | ICD-10-CM

## 2021-10-05 DIAGNOSIS — Z1231 Encounter for screening mammogram for malignant neoplasm of breast: Secondary | ICD-10-CM

## 2021-10-05 DIAGNOSIS — F321 Major depressive disorder, single episode, moderate: Secondary | ICD-10-CM

## 2021-10-05 DIAGNOSIS — R21 Rash and other nonspecific skin eruption: Secondary | ICD-10-CM

## 2021-10-05 DIAGNOSIS — F419 Anxiety disorder, unspecified: Secondary | ICD-10-CM

## 2021-10-05 DIAGNOSIS — D229 Melanocytic nevi, unspecified: Secondary | ICD-10-CM

## 2021-10-05 DIAGNOSIS — Z0001 Encounter for general adult medical examination with abnormal findings: Secondary | ICD-10-CM

## 2021-10-05 MED ORDER — BUSPIRONE HCL 10 MG PO TABS: 10.0000 mg | ORAL_TABLET | Freq: Two times a day (BID) | ORAL | 1 refills | Status: DC

## 2021-10-05 NOTE — Progress Notes (Signed)
Jordan [place] is a 41 y.o. female presents to office today for annual physical exam examination.    Concerns today include Moles on legs, recurrent facial rash Would like a referral to dermatology for moles checked. States facial rash appears every two weeks and lasts 2-3 days. States rash causes her face to feel hot, no rash present on exam today. Denies any changes in skin care or products.   Marital status: engaged, Substance use: denies Diet: good variety, Exercise: active Last eye exam: 2023 Last dental exam: 2023 Last colonoscopy: na Last mammogram: scheduled for 6/7 Last pap smear: 2022     10/05/2021   11:37 AM 06/09/2020    2:18 PM 07/12/2016   11:38 AM  Depression screen PHQ 2/9  Decreased Interest 2 0 0  Down, Depressed, Hopeless 1 0 0  PHQ - 2 Score 3 0 0  Altered sleeping 1    Tired, decreased energy 1    Change in appetite 1    Feeling bad or failure about yourself  1    Trouble concentrating 0    Moving slowly or fidgety/restless 0    Suicidal thoughts 0    PHQ-9 Score 7    Difficult doing work/chores Somewhat difficult        10/05/2021   11:38 AM  GAD 7 : Generalized Anxiety Score  Nervous, Anxious, on Edge 2  Control/stop worrying 2  Worry too much - different things 2  Trouble relaxing 2  Restless 2  Easily annoyed or irritable 2  Afraid - awful might happen 2  Total GAD 7 Score 14  Anxiety Difficulty Somewhat difficult    States she is interested in trying some medications as she has stress related to planning her upcoming wedding.   Past Medical History:  Diagnosis Date   Hyperlipidemia    Hypertension    Social History   Socioeconomic History   Marital status: Single    Spouse name: Not on file   Number of children: Not on file   Years of education: Not on file   Highest education level: Bachelor's degree (e.g., BA, AB, BS)  Occupational History   Not on file  Tobacco Use   Smoking status: Some Days    Packs/day: 0.25     Years: 5.00    Pack years: 1.25    Types: Cigarettes   Smokeless tobacco: Never  Vaping Use   Vaping Use: Never used  Substance and Sexual Activity   Alcohol use: Never   Drug use: Never   Sexual activity: Yes    Birth control/protection: None  Other Topics Concern   Not on file  Social History Narrative   Not on file   Social Determinants of Health   Financial Resource Strain: Not on file  Food Insecurity: Not on file  Transportation Needs: Not on file  Physical Activity: Not on file  Stress: Not on file  Social Connections: Not on file  Intimate Partner Violence: Not on file   Past Surgical History:  Procedure Laterality Date   AUGMENTATION MAMMAPLASTY Bilateral 2016   BREAST SURGERY     Family History  Problem Relation Age of Onset   Cancer Mother    Heart disease Father     Current Outpatient Medications:    cetirizine (ZYRTEC) 10 MG tablet, TAKE 1 TABLET BY MOUTH EVERY DAY, Disp: 90 tablet, Rfl: 1   fluticasone (FLONASE) 50 MCG/ACT nasal spray, Place 2 sprays into both nostrils daily., Disp: 16 g, Rfl:  6   ibuprofen (ADVIL) 200 MG tablet, Take 200 mg by mouth every 6 (six) hours as needed., Disp: , Rfl:   No Known Allergies   ROS: Review of System Pertinent items noted in HPI and remainder of comprehensive ROS otherwise negative.    Physical exam BP 115/79   Pulse 97   Temp 98.3 F (36.8 C) (Temporal)   Ht '5\' 7"'  (1.702 m)   Wt 74.5 kg   LMP 09/14/2021 (Approximate)   SpO2 100%   BMI 25.73 kg/m  General appearance: alert Head: Normocephalic, without obvious abnormality, atraumatic Eyes: conjunctivae/corneas clear. PERRLA Ears: normal TM's and external ear canals both ears Nose: Nares normal. Septum midline. Mucosa normal. No drainage or sinus tenderness. Throat: lips, mucosa, and tongue normal; teeth and gums normal Neck: no adenopathy, no carotid bruit, no JVD, supple, symmetrical, trachea midline, and thyroid not enlarged, symmetric, no  tenderness/mass/nodules Back: symmetric, no curvature. ROM normal. No CVA tenderness. Lungs: clear to auscultation bilaterally Heart: regular rate and rhythm, S1, S2 normal, no murmur, click, rub or gallop Abdomen: soft, non-tender; bowel sounds normal; no masses,  no organomegaly Extremities: extremities normal, atraumatic, no cyanosis or edema Pulses: 2+ and symmetric Skin: Skin color, texture, turgor normal. No rashes or lesions Lymph nodes: Cervical and supraclavicular normal. Neurologic: Alert and oriented X 3, normal strength and tone. Normal symmetric reflexes. Normal coordination and gait    Assessment/ Plan: Jordan [place] here for annual physical exam.   Jordan [place] was seen today for annual exam.  Diagnoses and all orders for this visit:  Routine general medical examination at a health care facility Fasting labs pending.  -     CBC with Differential/Platelet -     CMP14+EGFR -     Lipid panel -     Thyroid Panel With TSH  Depression, major, single episode, moderate (HCC) Anxiety Uncontrolled. Will start buspar as below. Denies SI. Follow up in 6 weeks to reassess. -     busPIRone (BUSPAR) 10 MG tablet; Take 1 tablet (10 mg total) by mouth 2 (two) times daily.  Numerous moles -     Ambulatory referral to Dermatology  Facial rash -     Ambulatory referral to Dermatology   Counseled on healthy lifestyle choices, including diet (rich in fruits, vegetables and lean meats and low in salt and simple carbohydrates) and exercise (at least 30 minutes of moderate physical activity daily).  Patient to follow up in 1 year for annual exam or sooner if needed.  The above assessment and management plan was discussed with the patient. The patient verbalized understanding of and has agreed to the management plan. Patient is aware to call the clinic if symptoms persist or worsen. Patient is aware when to return to the clinic for a follow-up visit. Patient educated on when it is  appropriate to go to the emergency department.   Jordan Smolder, FNP-C [facility] [address] [place], [place] [address] [phone]  I personally was present during the history, physical exam, and medical decision-making activities of this service and have verified that the service and findings are accurately documented in the nurse practitioner student's note.  Jordan Smolder, FNP

## 2021-10-05 NOTE — Patient Instructions (Signed)

## 2021-10-06 LAB — CMP14+EGFR
ALT: 12 IU/L (ref 0–32)
AST: 16 IU/L (ref 0–40)
Albumin/Globulin Ratio: 1.8 (ref 1.2–2.2)
Albumin: 4.6 g/dL (ref 3.8–4.8)
Alkaline Phosphatase: 52 IU/L (ref 44–121)
BUN/Creatinine Ratio: 13 (ref 9–23)
BUN: 10 mg/dL (ref 6–24)
Bilirubin Total: 0.3 mg/dL (ref 0.0–1.2)
CO2: 20 mmol/L (ref 20–29)
Calcium: 9.3 mg/dL (ref 8.7–10.2)
Chloride: 105 mmol/L (ref 96–106)
Creatinine, Ser: 0.79 mg/dL (ref 0.57–1.00)
Globulin, Total: 2.5 g/dL (ref 1.5–4.5)
Glucose: 83 mg/dL (ref 70–99)
Potassium: 4 mmol/L (ref 3.5–5.2)
Sodium: 138 mmol/L (ref 134–144)
Total Protein: 7.1 g/dL (ref 6.0–8.5)
eGFR: 97 mL/min/{1.73_m2} (ref >59)

## 2021-10-06 LAB — LIPID PANEL
Chol/HDL Ratio: 2.7 ratio (ref 0.0–4.4)
Cholesterol, Total: 175 mg/dL (ref 100–199)
HDL: 66 mg/dL (ref >39)
LDL Chol Calc (NIH): 97 mg/dL (ref 0–99)
Triglycerides: 61 mg/dL (ref 0–149)
VLDL Cholesterol Cal: 12 mg/dL (ref 5–40)

## 2021-10-06 LAB — CBC WITH DIFFERENTIAL/PLATELET
Basophils Absolute: 0 10*3/uL (ref 0.0–0.2)
Basos: 1 %
EOS (ABSOLUTE): 0.1 10*3/uL (ref 0.0–0.4)
Eos: 1 %
Hematocrit: 37.2 % (ref 34.0–46.6)
Hemoglobin: 12.2 g/dL (ref 11.1–15.9)
Immature Grans (Abs): 0 10*3/uL (ref 0.0–0.1)
Immature Granulocytes: 0 %
Lymphocytes Absolute: 1.8 10*3/uL (ref 0.7–3.1)
Lymphs: 35 %
MCH: 26.8 pg (ref 26.6–33.0)
MCHC: 32.8 g/dL (ref 31.5–35.7)
MCV: 82 fL (ref 79–97)
Monocytes Absolute: 0.3 10*3/uL (ref 0.1–0.9)
Monocytes: 6 %
Neutrophils Absolute: 3.1 10*3/uL (ref 1.4–7.0)
Neutrophils: 57 %
Platelets: 390 10*3/uL (ref 150–450)
RBC: 4.56 x10E6/uL (ref 3.77–5.28)
RDW: 13 % (ref 11.7–15.4)
WBC: 5.3 10*3/uL (ref 3.4–10.8)

## 2021-10-06 LAB — THYROID PANEL WITH TSH
Free Thyroxine Index: 2.6 (ref 1.2–4.9)
T3 Uptake Ratio: 31 % (ref 24–39)
T4, Total: 8.5 ug/dL (ref 4.5–12.0)
TSH: 1.86 u[IU]/mL (ref 0.450–4.500)

## 2021-11-21 ENCOUNTER — Ambulatory Visit: Payer: Self-pay | Admitting: Family

## 2022-01-09 ENCOUNTER — Ambulatory Visit: Payer: Self-pay | Admitting: Dermatology

## 2022-07-11 ENCOUNTER — Encounter: Payer: Self-pay | Admitting: Nurse Practitioner

## 2022-07-11 ENCOUNTER — Telehealth (INDEPENDENT_AMBULATORY_CARE_PROVIDER_SITE_OTHER): Payer: Self-pay | Admitting: Nurse Practitioner

## 2022-07-11 DIAGNOSIS — J040 Acute laryngitis: Secondary | ICD-10-CM

## 2022-07-11 DIAGNOSIS — J40 Bronchitis, not specified as acute or chronic: Secondary | ICD-10-CM

## 2022-07-11 MED ORDER — AZITHROMYCIN 250 MG PO TABS: ORAL_TABLET | ORAL | 0 refills | Status: DC

## 2022-07-11 MED ORDER — PREDNISONE 20 MG PO TABS: 40.0000 mg | ORAL_TABLET | Freq: Every day | ORAL | 0 refills | Status: AC

## 2022-07-11 MED ORDER — BENZONATATE 100 MG PO CAPS: 100.0000 mg | ORAL_CAPSULE | Freq: Three times a day (TID) | ORAL | 0 refills | Status: DC | PRN

## 2022-07-11 NOTE — Patient Instructions (Signed)
Jordan Berger, thank you for joining Chevis Pretty, FNP for today's virtual visit.  While this provider is not your primary care provider (PCP), if your PCP is located in our provider database this encounter information will be shared with them immediately following your visit.   [place] account gives you access to today's visit and all your visits, tests, and labs performed at [facility] " click here if you don't have a [facility] account or go to [email or web address]  Consent: (Patient) Jordan Berger provided verbal consent for this virtual visit at the beginning of the encounter.  Current Medications:  Current Outpatient Medications:    azithromycin (ZITHROMAX Z-PAK) 250 MG tablet, As directed, Disp: 6 tablet, Rfl: 0   benzonatate (TESSALON PERLES) 100 MG capsule, Take 1 capsule (100 mg total) by mouth 3 (three) times daily as needed for cough., Disp: 20 capsule, Rfl: 0   predniSONE (DELTASONE) 20 MG tablet, Take 2 tablets (40 mg total) by mouth daily with breakfast for 5 days. 2 po daily for 5 days, Disp: 10 tablet, Rfl: 0   busPIRone (BUSPAR) 10 MG tablet, Take 1 tablet (10 mg total) by mouth 2 (two) times daily., Disp: 60 tablet, Rfl: 1   cetirizine (ZYRTEC) 10 MG tablet, TAKE 1 TABLET BY MOUTH EVERY DAY, Disp: 90 tablet, Rfl: 1   fluticasone (FLONASE) 50 MCG/ACT nasal spray, Place 2 sprays into both nostrils daily., Disp: 16 g, Rfl: 6   ibuprofen (ADVIL) 200 MG tablet, Take 200 mg by mouth every 6 (six) hours as needed., Disp: , Rfl:    Medications ordered in this encounter:  Meds ordered this encounter  Medications   azithromycin (ZITHROMAX Z-PAK) 250 MG tablet    Sig: As directed    Dispense:  6 tablet    Refill:  0    Order Specific Question:   Supervising Provider    Answer:   Caryl Pina A [id number]   benzonatate (TESSALON PERLES) 100 MG capsule    Sig: Take 1 capsule (100 mg total) by mouth 3 (three) times  daily as needed for cough.    Dispense:  20 capsule    Refill:  0    Order Specific Question:   Supervising Provider    Answer:   Caryl Pina A [[id number]]   predniSONE (DELTASONE) 20 MG tablet    Sig: Take 2 tablets (40 mg total) by mouth daily with breakfast for 5 days. 2 po daily for 5 days    Dispense:  10 tablet    Refill:  0    Order Specific Question:   Supervising Provider    Answer:   Caryl Pina A [id number]     *If you need refills on other medications prior to your next appointment, please contact your pharmacy*  Follow-Up: Call back or seek an in-person evaluation if the symptoms worsen or if the condition fails to improve as anticipated.  [place]  Other Instructions 1. Take meds as prescribed 2. Use a cool mist humidifier especially during the winter months and when heat has been humid. 3. Use saline nose sprays frequently 4. Saline irrigations of the nose can be very helpful if done frequently.  * 4X daily for 1 week*  * Use of a nettie pot can be helpful with this. Follow directions with this* 5. Drink plenty of fluids 6. Keep thermostat turn down low 7.For any cough or congestion- tessalon perles 8. For fever or aces or pains- take  tylenol or ibuprofen appropriate for age and weight.  * for fevers greater than 101 orally you may alternate ibuprofen and tylenol every  3 hours.      If you have been instructed to have an in-person evaluation today at a local Urgent Care facility, please use the link below. It will take you to a list of all of our available [facility], including address, phone number and hours of operation. Please do not delay care.  [facility]  If you or a family member do not have a primary care provider, use the link below to schedule a visit and establish care. When you choose a [facility] primary care physician or advanced practice provider, you gain a long-term partner in  health. Find a Primary Care Provider  Learn more about [facility]'s in-office and virtual care options: [facility]

## 2022-07-11 NOTE — Progress Notes (Signed)
Virtual Visit Consent   Jordan Berger, you are scheduled for Berger virtual visit with Jordan Berger, [place], Berger [facility] provider, today.     Just as with appointments in the office, your consent must be obtained to participate.  Your consent will be active for this visit and any virtual visit you may have with one of our providers in the next 365 days.     If you have Berger MyChart account, Berger copy of this consent can be sent to you electronically.  All virtual visits are billed to your insurance company just like Berger traditional visit in the office.    As this is Berger virtual visit, video technology does not allow for your provider to perform Berger traditional examination.  This may limit your provider's ability to fully assess your condition.  If your provider identifies any concerns that need to be evaluated in person or the need to arrange testing (such as labs, EKG, etc.), we will make arrangements to do so.     Although advances in technology are sophisticated, we cannot ensure that it will always work on either your end or our end.  If the connection with Berger video visit is poor, the visit may have to be switched to Berger telephone visit.  With either Berger video or telephone visit, we are not always able to ensure that we have Berger secure connection.     I need to obtain your verbal consent now.   Are you willing to proceed with your visit today? YES   Jordan Berger has provided verbal consent on 07/11/2022 for Berger virtual visit (video or telephone).   Jordan Hassell Done, FNP   Date: 07/11/2022 3:18 PM   Virtual Visit via Video Note   I, Jordan Berger, connected with Jordan Berger ([id number], Feb 21, 1981) on 07/11/22 at  3:30 PM EDT by Berger video-enabled telemedicine application and verified that I am speaking with the correct person using two identifiers.  Location: Patient: Virtual Visit Location Patient: Berger Provider: Virtual Visit Location Provider: Mobile   I discussed the  limitations of evaluation and management by telemedicine and the availability of in person appointments. The patient expressed understanding and agreed to proceed.    History of Present Illness: Jordan Berger is Berger 42 y.o. who identifies as Berger female who was assigned female at birth, and is being seen today for uri.  HPI: URI  This is Berger new problem. The current episode started in the past 7 days. The problem has been waxing and waning. There has been no fever. Associated symptoms include congestion, coughing, headaches, rhinorrhea and Berger sore throat. She has tried acetaminophen for the symptoms. The treatment provided mild relief.    Review of Systems  HENT:  Positive for congestion, rhinorrhea and sore throat.   Respiratory:  Positive for cough.   Neurological:  Positive for headaches.    Problems: There are no problems to display for this patient.   Allergies: No Known Allergies Medications:  Current Outpatient Medications:    busPIRone (BUSPAR) 10 MG tablet, Take 1 tablet (10 mg total) by mouth 2 (two) times daily., Disp: 60 tablet, Rfl: 1   cetirizine (ZYRTEC) 10 MG tablet, TAKE 1 TABLET BY MOUTH EVERY DAY, Disp: 90 tablet, Rfl: 1   fluticasone (FLONASE) 50 MCG/ACT nasal spray, Place 2 sprays into both nostrils daily., Disp: 16 g, Rfl: 6   ibuprofen (ADVIL) 200 MG tablet, Take 200 mg by mouth every 6 (six) hours as needed., Disp: , Rfl:  Observations/Objective: Patient is well-developed, well-nourished in no acute distress.  Resting comfortably  at Berger.  Head is normocephalic, atraumatic.  No labored breathing.  Speech is clear and coherent with logical content.  Patient is alert and oriented at baseline.  Weak hoarse voice Deep cough  Assessment and Plan:  Jordan Berger in today with chief complaint of No chief complaint on file.   1. Laryngitis 2. Bronchitis 1. Take meds as prescribed 2. Use Berger cool mist humidifier especially during the winter months and when  heat has been humid. 3. Use saline nose sprays frequently 4. Saline irrigations of the nose can be very helpful if Berger frequently.  * 4X daily for 1 week*  * Use of Berger nettie pot can be helpful with this. Follow directions with this* 5. Drink plenty of fluids 6. Keep thermostat turn down low 7.For any cough or congestion- tessalon perles 8. For fever or aces or pains- take tylenol or ibuprofen appropriate for age and weight.  * for fevers greater than 101 orally you may alternate ibuprofen and tylenol every  3 hours.   Meds ordered this encounter  Medications   azithromycin (ZITHROMAX Z-PAK) 250 MG tablet    Sig: As directed    Dispense:  6 tablet    Refill:  0    Order Specific Question:   Supervising Provider    Answer:   Jordan Berger [id number]   benzonatate (TESSALON PERLES) 100 MG capsule    Sig: Take 1 capsule (100 mg total) by mouth 3 (three) times daily as needed for cough.    Dispense:  20 capsule    Refill:  0    Order Specific Question:   Supervising Provider    Answer:   Jordan Berger [[id number]]   predniSONE (DELTASONE) 20 MG tablet    Sig: Take 2 tablets (40 mg total) by mouth daily with breakfast for 5 days. 2 po daily for 5 days    Dispense:  10 tablet    Refill:  0    Order Specific Question:   Supervising Provider    Answer:   Jordan Berger [id number]      Follow Up Instructions: I discussed the assessment and treatment plan with the patient. The patient was provided an opportunity to ask questions and all were answered. The patient agreed with the plan and demonstrated an understanding of the instructions.  Berger copy of instructions were sent to the patient via MyChart.  The patient was advised to call back or seek an in-person evaluation if the symptoms worsen or if the condition fails to improve as anticipated.  Time:  I spent 9 minutes with the patient via telehealth technology discussing the above problems/concerns.    Jordan  Hassell Done, FNP

## 2022-10-23 DIAGNOSIS — Z319 Encounter for procreative management, unspecified: Secondary | ICD-10-CM | POA: Diagnosis not present

## 2022-10-23 DIAGNOSIS — N979 Female infertility, unspecified: Secondary | ICD-10-CM | POA: Diagnosis not present

## 2022-11-17 ENCOUNTER — Other Ambulatory Visit: Payer: Self-pay | Admitting: Family Medicine

## 2022-11-17 DIAGNOSIS — Z1231 Encounter for screening mammogram for malignant neoplasm of breast: Secondary | ICD-10-CM

## 2022-11-22 ENCOUNTER — Ambulatory Visit: Admission: RE | Admit: 2022-11-22 | Payer: BC Managed Care – PPO | Source: Ambulatory Visit

## 2022-11-22 ENCOUNTER — Other Ambulatory Visit: Payer: Self-pay | Admitting: Family Medicine

## 2022-11-22 ENCOUNTER — Ambulatory Visit (INDEPENDENT_AMBULATORY_CARE_PROVIDER_SITE_OTHER): Payer: BC Managed Care – PPO | Admitting: Family Medicine

## 2022-11-22 ENCOUNTER — Encounter: Payer: Self-pay | Admitting: Family Medicine

## 2022-11-22 VITALS — BP 123/76 | HR 93 | Temp 97.9°F | Resp 20 | Ht 67.0 in | Wt 177.0 lb

## 2022-11-22 DIAGNOSIS — Z1231 Encounter for screening mammogram for malignant neoplasm of breast: Secondary | ICD-10-CM | POA: Diagnosis not present

## 2022-11-22 DIAGNOSIS — Z0000 Encounter for general adult medical examination without abnormal findings: Secondary | ICD-10-CM

## 2022-11-22 DIAGNOSIS — F339 Major depressive disorder, recurrent, unspecified: Secondary | ICD-10-CM | POA: Diagnosis not present

## 2022-11-22 DIAGNOSIS — Z0001 Encounter for general adult medical examination with abnormal findings: Secondary | ICD-10-CM | POA: Diagnosis not present

## 2022-11-22 DIAGNOSIS — Z130 Encounter for screening for diseases of the blood and blood-forming organs and certain disorders involving the immune mechanism: Secondary | ICD-10-CM

## 2022-11-22 DIAGNOSIS — Z13220 Encounter for screening for lipoid disorders: Secondary | ICD-10-CM

## 2022-11-22 NOTE — Progress Notes (Signed)
Complete physical exam  Patient: Jordan Berger   DOB: 1981-02-01   42 y.o. Female  MRN: [id number]  Subjective:    Chief Complaint  Patient presents with   Annual Exam    No pap     Jordan Berger is a 42 y.o. female who presents today for a complete physical exam. She reports consuming a general diet. The patient has a physically strenuous job, but has no regular exercise apart from work.  She generally feels well. She reports sleeping fairly well. She does have additional problems to discuss today.   She recently had labs done with OB and some labs done. She recently married and is planning to try for a baby. CBC and TSH were normal on labs. She is not fasting today. She has her mammogram scheduled for today.   Feels like anxiety and depression is fairly manageable. She is open with her husband. Reports passive SI at times. Denies plan or intent. Declines medication or referral at this time.   Most recent fall risk assessment:    11/22/2022   11:04 AM  Fall Risk   Falls in the past year? 0     Most recent depression screenings:    11/22/2022   11:04 AM 10/05/2021   11:37 AM 06/09/2020    2:18 PM  Depression screen PHQ 2/9  Decreased Interest 1 2 0  Down, Depressed, Hopeless 1 1 0  PHQ - 2 Score 2 3 0  Altered sleeping 1 1   Tired, decreased energy 1 1   Change in appetite 0 1   Feeling bad or failure about yourself  1 1   Trouble concentrating 1 0   Moving slowly or fidgety/restless 1 0   Suicidal thoughts 1 0   PHQ-9 Score 8 7   Difficult doing work/chores Somewhat difficult Somewhat difficult       11/22/2022   11:04 AM 10/05/2021   11:38 AM  GAD 7 : Generalized Anxiety Score  Nervous, Anxious, on Edge 1 2  Control/stop worrying 1 2  Worry too much - different things 1 2  Trouble relaxing 1 2  Restless 1 2  Easily annoyed or irritable 1 2  Afraid - awful might happen 1 2  Total GAD 7 Score 7 14  Anxiety Difficulty Somewhat difficult Somewhat difficult       Vision:Within last year and Dental: No current dental problems and Receives regular dental care  Past Medical History:  Diagnosis Date   Hyperlipidemia    Hypertension       Patient Care Team: Gabriel Earing, FNP as PCP - General (Family Medicine)   Outpatient Medications Prior to Visit  Medication Sig   [DISCONTINUED] azithromycin (ZITHROMAX Z-PAK) 250 MG tablet As directed   [DISCONTINUED] benzonatate (TESSALON PERLES) 100 MG capsule Take 1 capsule (100 mg total) by mouth 3 (three) times daily as needed for cough.   [DISCONTINUED] busPIRone (BUSPAR) 10 MG tablet Take 1 tablet (10 mg total) by mouth 2 (two) times daily.   [DISCONTINUED] cetirizine (ZYRTEC) 10 MG tablet TAKE 1 TABLET BY MOUTH EVERY DAY   [DISCONTINUED] fluticasone (FLONASE) 50 MCG/ACT nasal spray Place 2 sprays into both nostrils daily.   [DISCONTINUED] ibuprofen (ADVIL) 200 MG tablet Take 200 mg by mouth every 6 (six) hours as needed.   No facility-administered medications prior to visit.    ROS Negative unless specially indicated above in HPI.     Objective:     BP 123/76  Pulse 93   Temp 97.9 F (36.6 C) (Temporal)   Resp 20   Ht 5\' 7"  (1.702 m)   Wt 177 lb (80.3 kg)   LMP 11/11/2022 (Approximate)   SpO2 100%   BMI 27.72 kg/m  Wt Readings from Last 3 Encounters:  11/22/22 177 lb (80.3 kg)  10/05/21 164 lb 4 oz (74.5 kg)  07/27/21 164 lb (74.4 kg)      Physical Exam Vitals and nursing note reviewed.  Constitutional:      General: She is not in acute distress.    Appearance: Normal appearance. She is not ill-appearing.  HENT:     Head: Normocephalic.     Right Ear: Tympanic membrane, ear canal and external ear normal.     Left Ear: Tympanic membrane, ear canal and external ear normal.     Nose: Nose normal.     Mouth/Throat:     Mouth: Mucous membranes are moist.     Pharynx: Oropharynx is clear.  Eyes:     Extraocular Movements: Extraocular movements intact.      Conjunctiva/sclera: Conjunctivae normal.     Pupils: Pupils are equal, round, and reactive to light.  Neck:     Thyroid: No thyroid mass, thyromegaly or thyroid tenderness.  Cardiovascular:     Rate and Rhythm: Normal rate and regular rhythm.     Pulses: Normal pulses.     Heart sounds: Normal heart sounds. No murmur heard.    No friction rub. No gallop.  Pulmonary:     Effort: Pulmonary effort is normal.     Breath sounds: Normal breath sounds.  Abdominal:     General: Bowel sounds are normal. There is no distension.     Palpations: Abdomen is soft. There is no mass.     Tenderness: There is no abdominal tenderness. There is no guarding.  Musculoskeletal:        General: No swelling or tenderness. Normal range of motion.     Cervical back: Normal range of motion and neck supple. No rigidity or tenderness.     Right lower leg: No edema.     Left lower leg: No edema.  Lymphadenopathy:     Cervical: No cervical adenopathy.  Skin:    General: Skin is warm and dry.     Capillary Refill: Capillary refill takes less than 2 seconds.     Findings: No lesion or rash.  Neurological:     General: No focal deficit present.     Mental Status: She is alert and oriented to person, place, and time.     Cranial Nerves: No cranial nerve deficit.     Motor: No weakness.     Coordination: Coordination normal.     Gait: Gait normal.  Psychiatric:        Mood and Affect: Mood normal.        Behavior: Behavior normal.        Thought Content: Thought content normal.        Judgment: Judgment normal.      No results found for any visits on 11/22/22.     Assessment & Plan:    Routine Health Maintenance and Physical Exam  Jordan "Joy" was seen today for annual exam.  Diagnoses and all orders for this visit:  Routine general medical examination at a health care facility  Screening for endocrine, metabolic and immunity disorder Reviewed recent CBC and TSH in [facility] portal. She will  return for fasting labs.  -  CMP14+EGFR; Future -     VITAMIN D 25 Hydroxy (Vit-D Deficiency, Fractures); Future  Encounter for screening for lipid disorder -     Lipid panel; Future  Depression, recurrent (HCC) Passive SI, denies plan or intent. Able to contract verbally for safety. Declined referral or treatment today. Has good family support.     Immunization History  Administered Date(s) Administered   PFIZER(Purple Top)SARS-COV-2 Vaccination 07/10/2019, 08/05/2019, 02/17/2020   Tdap 06/09/2020    Health Maintenance  Topic Date Due   HIV Screening  Never done   Hepatitis C Screening  Never done   MAMMOGRAM  10/06/2022   COVID-19 Vaccine (4 - 2023-24 season) 12/08/2022 (Originally 12/30/2021)   INFLUENZA VACCINE  11/30/2022   PAP SMEAR-Modifier  06/10/2023   DTaP/Tdap/Td (2 - Td or Tdap) 06/09/2030   HPV VACCINES  Aged Out    Discussed health benefits of physical activity, and encouraged her to engage in regular exercise appropriate for her age and condition.  Problem List Items Addressed This Visit   None Visit Diagnoses     Routine general medical examination at a health care facility    -  Primary   Screening for endocrine, metabolic and immunity disorder       Relevant Orders   CMP14+EGFR   VITAMIN D 25 Hydroxy (Vit-D Deficiency, Fractures)   Encounter for screening for lipid disorder       Relevant Orders   Lipid panel   Depression, recurrent (HCC)          Return in about 1 year (around 11/22/2023) for CPE. Lab only appt in next few weeks for fasting labs.   The patient indicates understanding of these issues and agrees with the plan.  Gabriel Earing, FNP

## 2022-11-22 NOTE — Patient Instructions (Signed)

## 2022-11-27 ENCOUNTER — Other Ambulatory Visit: Payer: BC Managed Care – PPO

## 2022-11-27 DIAGNOSIS — Z130 Encounter for screening for diseases of the blood and blood-forming organs and certain disorders involving the immune mechanism: Secondary | ICD-10-CM | POA: Diagnosis not present

## 2022-11-27 DIAGNOSIS — Z13220 Encounter for screening for lipoid disorders: Secondary | ICD-10-CM

## 2022-11-27 DIAGNOSIS — Z13228 Encounter for screening for other metabolic disorders: Secondary | ICD-10-CM | POA: Diagnosis not present

## 2022-11-27 DIAGNOSIS — Z1329 Encounter for screening for other suspected endocrine disorder: Secondary | ICD-10-CM | POA: Diagnosis not present

## 2022-11-29 ENCOUNTER — Other Ambulatory Visit: Payer: Self-pay | Admitting: Family Medicine

## 2022-11-29 DIAGNOSIS — E559 Vitamin D deficiency, unspecified: Secondary | ICD-10-CM

## 2022-11-29 MED ORDER — VITAMIN D (ERGOCALCIFEROL) 1.25 MG (50000 UNIT) PO CAPS: 50000.0000 [IU] | ORAL_CAPSULE | ORAL | 0 refills | Status: DC

## 2022-12-06 ENCOUNTER — Ambulatory Visit: Payer: Self-pay | Admitting: Family

## 2022-12-30 IMAGING — MG DIGITAL SCREENING BREAST BILAT IMPLANT W/ TOMO W/ CAD
8 of 16 series · 8 of 40 positions shown · non-contrast
Comparison: Previous exam(s).

CLINICAL DATA: Screening.

EXAM:
DIGITAL SCREENING BILATERAL MAMMOGRAM WITH IMPLANTS, CAD AND
TOMOSYNTHESIS
TECHNIQUE: Bilateral screening digital craniocaudal and mediolateral oblique
mammograms were obtained. Bilateral screening digital breast
tomosynthesis was performed. The images were evaluated with
computer-aided detection. Standard and/or implant displaced views
were performed.

[L CC]
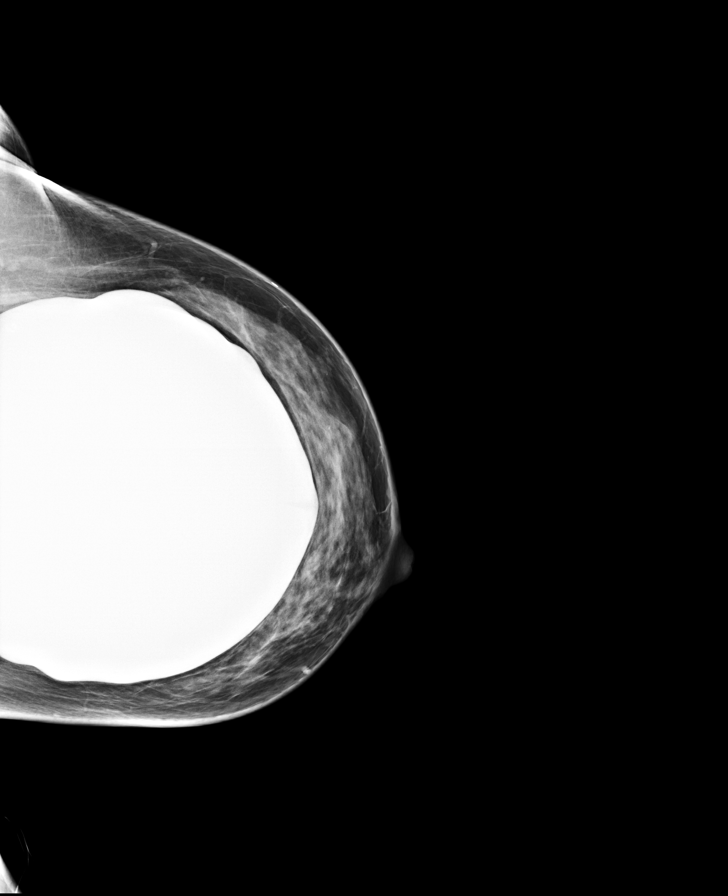

[L MLO]
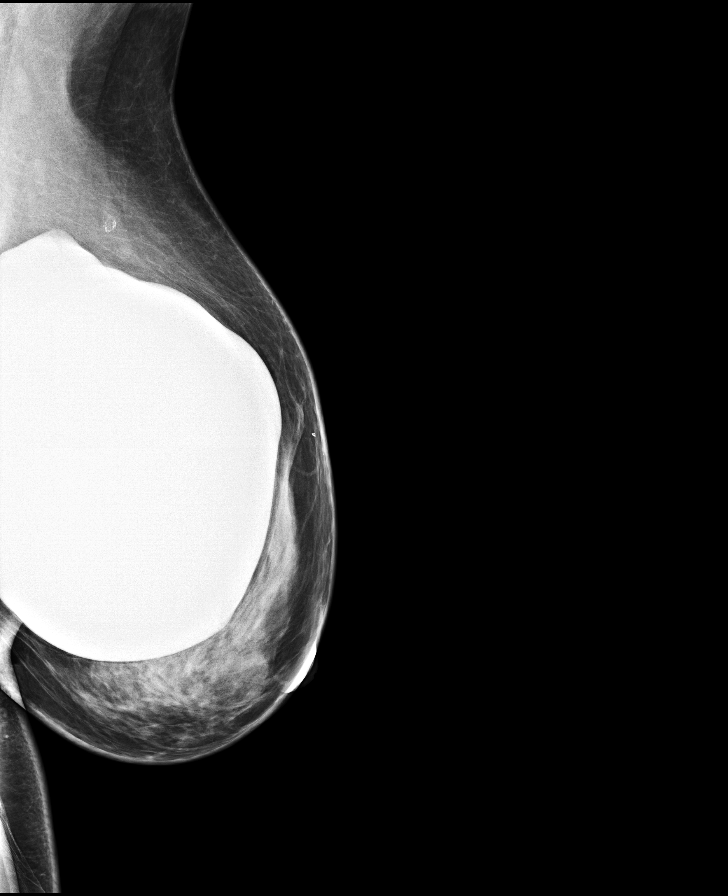

[R CC]
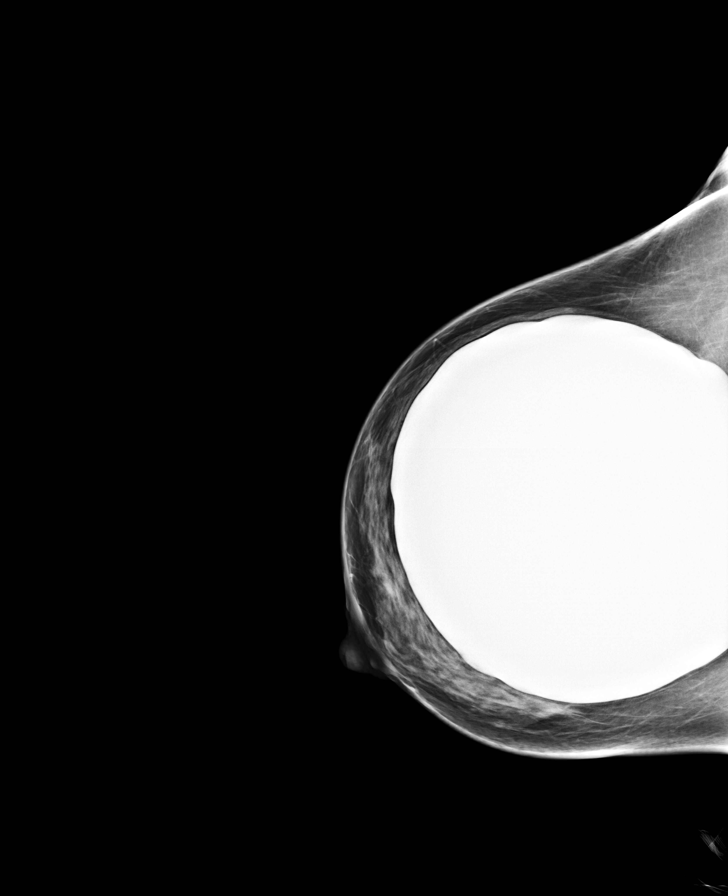

[R MLO]
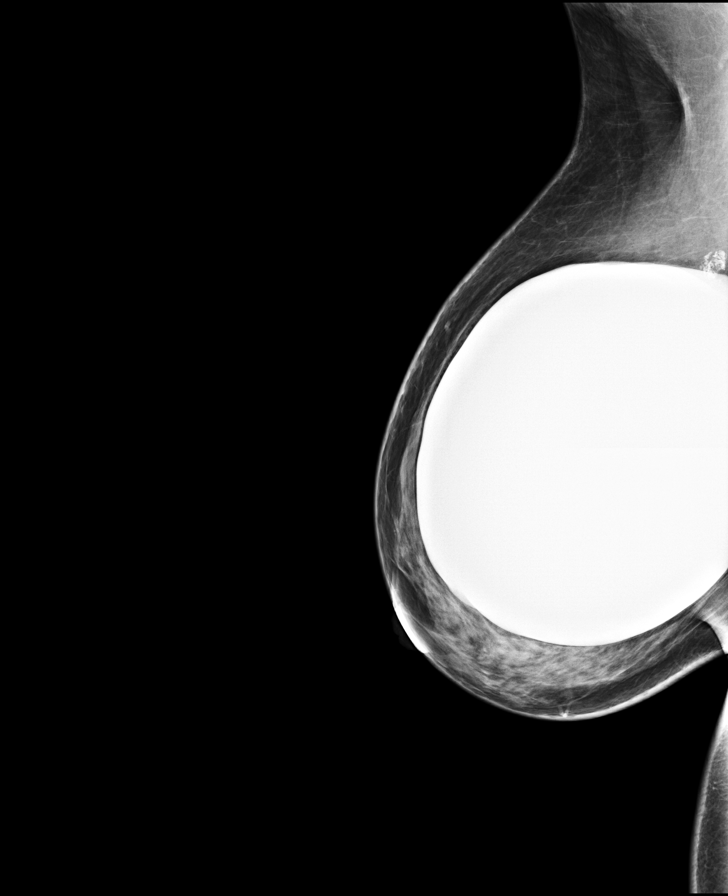

[R CC synth-2D]
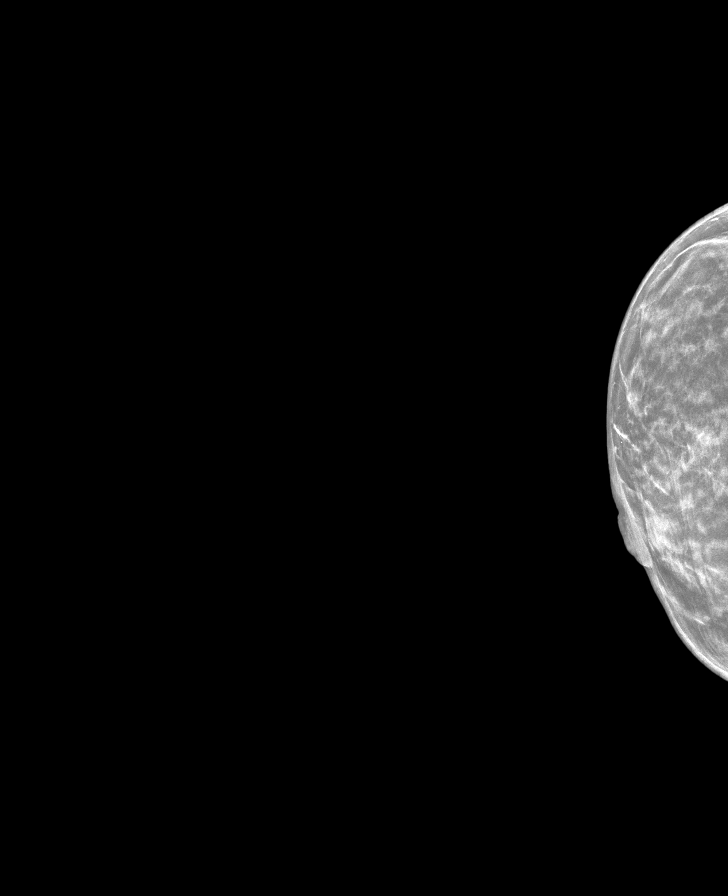

[R MLO synth-2D (1 of 2)]
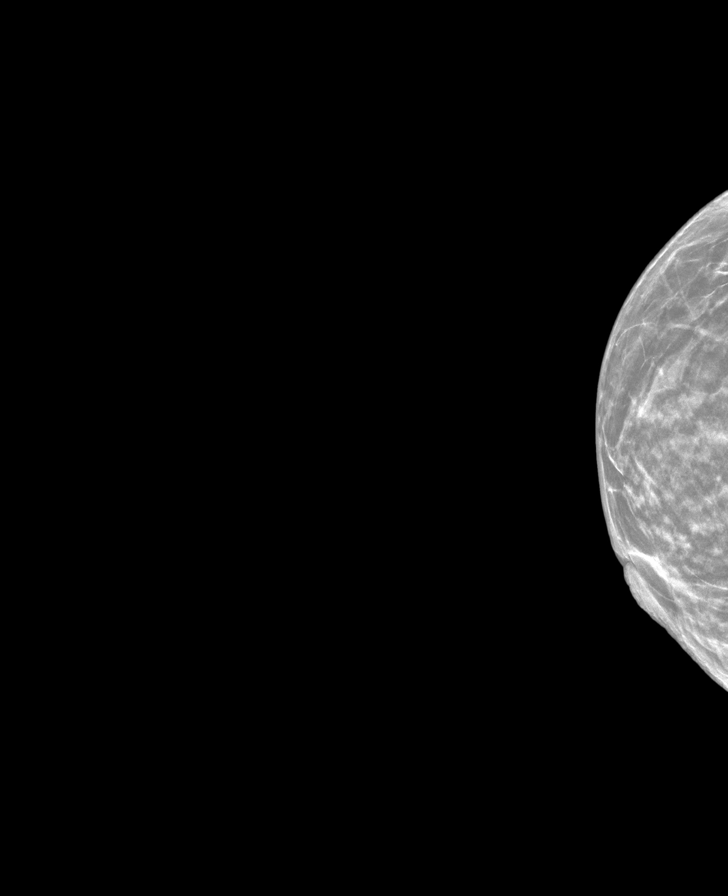

[R MLO synth-2D (2 of 2)]
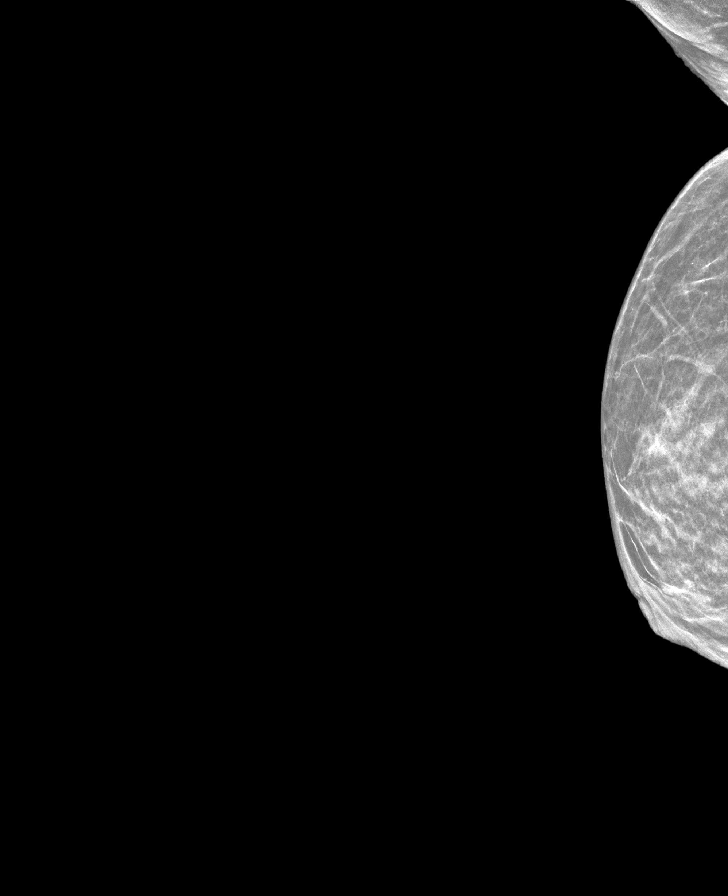

[L MLO synth-2D]
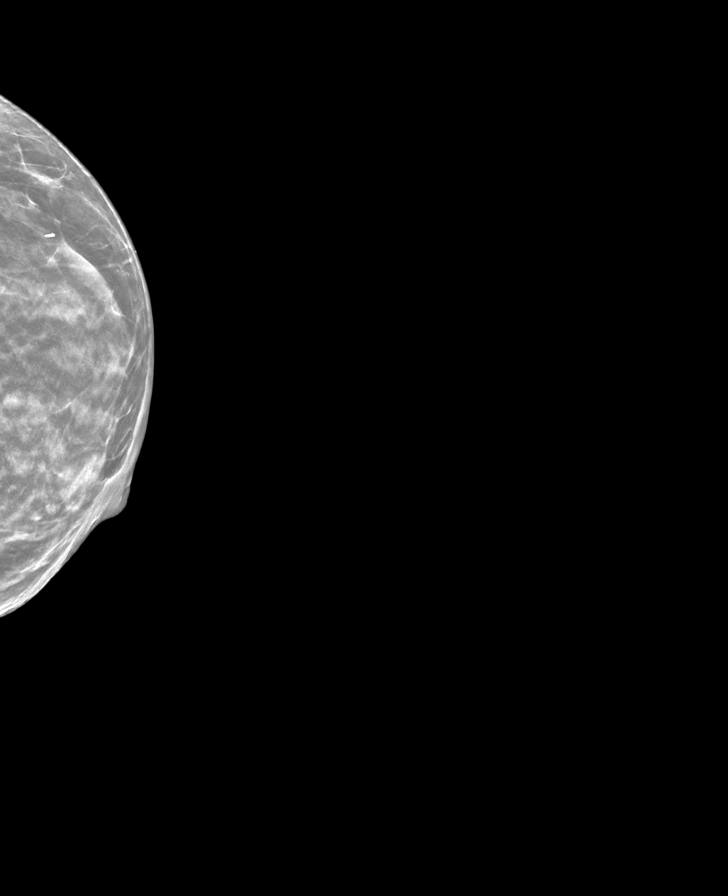

[8 of 40 positions shown; findings below may reference images not displayed]

ACR Breast Density Category c: The breast tissue is heterogeneously
dense, which may obscure small masses.
FINDINGS: The patient has retropectoral implants. There are no findings
suspicious for malignancy.
IMPRESSION: No mammographic evidence of malignancy. A result letter of this
screening mammogram will be mailed directly to the patient.

RECOMMENDATION:
Screening mammogram in one year. (Code:LT-E-7TH)

BI-RADS CATEGORY  1:  Negative.

## 2023-09-04 ENCOUNTER — Telehealth: Payer: Self-pay | Admitting: Family Medicine

## 2023-09-04 NOTE — Telephone Encounter (Signed)
 Please add future lab order. Pt coming in to do labs before her appt on 12/26/23.

## 2023-09-05 ENCOUNTER — Other Ambulatory Visit: Payer: Self-pay | Admitting: Family Medicine

## 2023-09-05 DIAGNOSIS — Z130 Encounter for screening for diseases of the blood and blood-forming organs and certain disorders involving the immune mechanism: Secondary | ICD-10-CM

## 2023-09-05 DIAGNOSIS — Z13220 Encounter for screening for lipoid disorders: Secondary | ICD-10-CM

## 2023-09-05 DIAGNOSIS — E559 Vitamin D deficiency, unspecified: Secondary | ICD-10-CM

## 2023-09-05 NOTE — Telephone Encounter (Signed)
Ordered added.

## 2023-12-21 ENCOUNTER — Other Ambulatory Visit: Payer: Self-pay | Admitting: Family Medicine

## 2023-12-21 DIAGNOSIS — Z1231 Encounter for screening mammogram for malignant neoplasm of breast: Secondary | ICD-10-CM

## 2023-12-26 ENCOUNTER — Ambulatory Visit
Admission: RE | Admit: 2023-12-26 | Discharge: 2023-12-26 | Disposition: A | Discharge status: Home / Self Care | Source: Ambulatory Visit | Attending: Family Medicine

## 2023-12-26 ENCOUNTER — Other Ambulatory Visit

## 2023-12-26 ENCOUNTER — Other Ambulatory Visit (HOSPITAL_COMMUNITY)
Admission: RE | Admit: 2023-12-26 | Discharge: 2023-12-26 | Disposition: A | Discharge status: Home / Self Care | Source: Ambulatory Visit | Attending: Family Medicine | Admitting: Family Medicine

## 2023-12-26 ENCOUNTER — Encounter: Payer: Self-pay | Admitting: Family Medicine

## 2023-12-26 ENCOUNTER — Ambulatory Visit: Admitting: Family Medicine

## 2023-12-26 VITALS — BP 105/64 | HR 91 | Temp 98.0°F | Ht 67.0 in | Wt 199.0 lb

## 2023-12-26 DIAGNOSIS — Z1231 Encounter for screening mammogram for malignant neoplasm of breast: Secondary | ICD-10-CM | POA: Diagnosis not present

## 2023-12-26 DIAGNOSIS — E66811 Obesity, class 1: Secondary | ICD-10-CM | POA: Diagnosis not present

## 2023-12-26 DIAGNOSIS — Z6831 Body mass index (BMI) 31.0-31.9, adult: Secondary | ICD-10-CM

## 2023-12-26 DIAGNOSIS — Z13220 Encounter for screening for lipoid disorders: Secondary | ICD-10-CM

## 2023-12-26 DIAGNOSIS — Z124 Encounter for screening for malignant neoplasm of cervix: Secondary | ICD-10-CM | POA: Diagnosis not present

## 2023-12-26 DIAGNOSIS — Z1329 Encounter for screening for other suspected endocrine disorder: Secondary | ICD-10-CM

## 2023-12-26 DIAGNOSIS — Z0001 Encounter for general adult medical examination with abnormal findings: Secondary | ICD-10-CM | POA: Diagnosis not present

## 2023-12-26 DIAGNOSIS — Z319 Encounter for procreative management, unspecified: Secondary | ICD-10-CM

## 2023-12-26 DIAGNOSIS — R21 Rash and other nonspecific skin eruption: Secondary | ICD-10-CM | POA: Diagnosis not present

## 2023-12-26 DIAGNOSIS — Z0000 Encounter for general adult medical examination without abnormal findings: Secondary | ICD-10-CM

## 2023-12-26 LAB — LIPID PANEL

## 2023-12-26 MED ORDER — TRIAMCINOLONE ACETONIDE 0.1 % EX CREA
1.0000 | TOPICAL_CREAM | Freq: Two times a day (BID) | CUTANEOUS | 0 refills | Status: AC
Start: 2023-12-26 — End: ?

## 2023-12-26 NOTE — Progress Notes (Signed)
 Complete physical exam  Patient: Jordan Berger   DOB: March 02, 1981   43 y.o. Female  MRN: [id number]  Subjective:    Chief Complaint  Patient presents with   Annual Exam    Jordan Berger is a 43 y.o. female who presents today for a complete physical exam. She reports consuming a general diet. The patient does not participate in regular exercise at present. She generally feels fairly well. She reports sleeping well. She does have additional problems to discuss today.   Weight gain - Significant weight gain of approximately 30 to 40 pounds since her wedding - Current weight is 199 pounds - Weight gain attributed to poor dietary habits following a trip to [place] - Aware of weight loss medications such as Ozempic and Wegovy but has not used them due to cost and insurance issues - Acknowledges need for increased exercise and difficulty maintaining a healthy lifestyle  Infertility - Actively attempting conception since last year - Currently taking prenatal supplements - No further fertility treatments pursued due to cost concerns - Plans to see an OB GYN for evaluation  Gynecologic health - Last menstrual period was on the 12th of the month - Due for a Pap smear     Rash -Recurring to extremities -Dry, scaly skin -Has tried lotion without improvement.   Most recent fall risk assessment:    12/26/2023    1:20 PM  Fall Risk   Falls in the past year? 0     Most recent depression screenings:    12/26/2023    1:21 PM 11/22/2022   11:04 AM  PHQ 2/9 Scores  PHQ - 2 Score 0 2  PHQ- 9 Score 0 8        Patient Care Team: Joesph Annabella HERO, FNP as PCP - General (Family Medicine)   Outpatient Medications Prior to Visit  Medication Sig   [DISCONTINUED] Vitamin D , Ergocalciferol , (DRISDOL ) 1.25 MG (50000 UNIT) CAPS capsule Take 1 capsule (50,000 Units total) by mouth every 7 (seven) days.   No facility-administered medications prior to visit.    ROS Negative  unless specially indicated above in HPI.     Objective:     BP 105/64   Pulse 91   Temp 98 F (36.7 C) (Temporal)   Ht 5' 7 (1.702 m)   Wt 199 lb (90.3 kg)   LMP 12/11/2023 (Exact Date)   SpO2 100%   BMI 31.17 kg/m  Wt Readings from Last 3 Encounters:  12/26/23 199 lb (90.3 kg)  11/22/22 177 lb (80.3 kg)  10/05/21 164 lb 4 oz (74.5 kg)      Physical Exam Vitals and nursing note reviewed. Exam conducted with a chaperone present.  Constitutional:      General: She is not in acute distress.    Appearance: Normal appearance. She is not ill-appearing, toxic-appearing or diaphoretic.  HENT:     Head: Normocephalic.     Right Ear: Tympanic membrane, ear canal and external ear normal.     Left Ear: Tympanic membrane, ear canal and external ear normal.     Nose: Nose normal.     Mouth/Throat:     Mouth: Mucous membranes are moist.     Pharynx: Oropharynx is clear.  Eyes:     Extraocular Movements: Extraocular movements intact.     Conjunctiva/sclera: Conjunctivae normal.     Pupils: Pupils are equal, round, and reactive to light.  Neck:     Thyroid : No thyroid  mass, thyromegaly or thyroid  tenderness.  Cardiovascular:     Rate and Rhythm: Normal rate and regular rhythm.     Pulses: Normal pulses.     Heart sounds: Normal heart sounds. No murmur heard.    No friction rub. No gallop.  Pulmonary:     Effort: Pulmonary effort is normal.     Breath sounds: Normal breath sounds.  Abdominal:     General: Bowel sounds are normal. There is no distension.     Palpations: Abdomen is soft. There is no mass.     Tenderness: There is no abdominal tenderness. There is no guarding.  Genitourinary:    General: Normal vulva.     Exam position: Lithotomy position.     Vagina: Normal.     Cervix: Normal.     Uterus: Normal.   Musculoskeletal:     Cervical back: Normal range of motion and neck supple. No tenderness.     Right lower leg: No edema.     Left lower leg: No edema.   Skin:    General: Skin is warm and dry.     Capillary Refill: Capillary refill takes less than 2 seconds.     Findings: No lesion or rash.  Neurological:     General: No focal deficit present.     Mental Status: She is alert and oriented to person, place, and time.     Cranial Nerves: No cranial nerve deficit.     Motor: No weakness.     Coordination: Coordination normal.     Gait: Gait normal.  Psychiatric:        Mood and Affect: Mood normal.        Behavior: Behavior normal.        Thought Content: Thought content normal.        Judgment: Judgment normal.      No results found for any visits on 12/26/23.     Assessment & Plan:    Routine Health Maintenance and Physical Exam  Jordan Berger was seen today for annual exam.  Diagnoses and all orders for this visit:  Routine general medical examination at a health care facility  Screening for endocrine, metabolic and immunity disorder -     TSH -     CBC with Differential/Platelet -     CMP14+EGFR  Encounter for screening for lipid disorder -     Lipid panel  Class 1 obesity due to excess calories without serious comorbidity with body mass index (BMI) of 31.0 to 31.9 in adult  Cervical cancer screening -     Cytology - PAP  Rash -     triamcinolone  cream (KENALOG ) 0.1 %; Apply 1 Application topically 2 (two) times daily.     Desire for pregnancy Difficulty conceiving for over six months at age 29. - Recommend OB GYN for infertility evaluation - Advise partner evaluation for fertility.  Obesity Obesity with significant weight gain of 30-40 pounds over the past year. - Encourage gradual lifestyle changes, starting with a 30-minute walk once a week and increasing frequency. - Discussed high cost and insurance coverage issues of weight loss medications like Wegovy.  Rash - ? Eczema - OTC emollients daily - Use kenalog  BID prn  Immunization History  Administered Date(s) Administered   PFIZER(Purple  Top)SARS-COV-2 Vaccination 07/10/2019, 08/05/2019, 02/17/2020   Tdap 06/09/2020    Health Maintenance  Topic Date Due   HIV Screening  Never done   Hepatitis C Screening  Never done   Cervical Cancer Screening (HPV/Pap Cotest)  06/10/2023   MAMMOGRAM  11/22/2023   INFLUENZA VACCINE  07/29/2024 (Originally 11/30/2023)   Pneumococcal Vaccine (1 of 2 - PCV) 12/25/2024 (Originally 01/14/2000)   Hepatitis B Vaccines 19-59 Average Risk (1 of 3 - 19+ 3-dose series) 12/25/2024 (Originally 01/14/2000)   HPV VACCINES (1 - Risk 3-dose SCDM series) 12/25/2024 (Originally 01/14/2008)   COVID-19 Vaccine (4 - 2024-25 season) 01/10/2025 (Originally 12/31/2022)   DTaP/Tdap/Td (2 - Td or Tdap) 06/09/2030   Meningococcal B Vaccine  Aged Out    Discussed health benefits of physical activity, and encouraged her to engage in regular exercise appropriate for her age and condition.  Problem List Items Addressed This Visit   None Visit Diagnoses       Routine general medical examination at a health care facility    -  Primary     Screening for endocrine, metabolic and immunity disorder         Encounter for screening for lipid disorder         Class 1 obesity due to excess calories without serious comorbidity with body mass index (BMI) of 31.0 to 31.9 in adult         Cervical cancer screening       Relevant Orders   Cytology - PAP     Patient desires pregnancy         Rash       Relevant Medications   triamcinolone  cream (KENALOG ) 0.1 %      Return in 1 year (on 12/25/2024) for CPE.   The patient indicates understanding of these issues and agrees with the plan.  Annabella CHRISTELLA Search, FNP

## 2023-12-26 NOTE — Patient Instructions (Signed)

## 2023-12-27 ENCOUNTER — Ambulatory Visit: Payer: Self-pay | Admitting: Family Medicine

## 2023-12-27 LAB — TSH: TSH: 2.74 u[IU]/mL (ref 0.450–4.500)

## 2023-12-27 LAB — CBC WITH DIFFERENTIAL/PLATELET
Basophils Absolute: 0.1 x10E3/uL (ref 0.0–0.2)
Basos: 1 %
EOS (ABSOLUTE): 0.1 x10E3/uL (ref 0.0–0.4)
Eos: 1 %
Hematocrit: 40.5 % (ref 34.0–46.6)
Hemoglobin: 12.5 g/dL (ref 11.1–15.9)
Immature Grans (Abs): 0 x10E3/uL (ref 0.0–0.1)
Immature Granulocytes: 0 %
Lymphocytes Absolute: 2.2 x10E3/uL (ref 0.7–3.1)
Lymphs: 32 %
MCH: 26 pg — ABNORMAL LOW (ref 26.6–33.0)
MCHC: 30.9 g/dL — ABNORMAL LOW (ref 31.5–35.7)
MCV: 84 fL (ref 79–97)
Monocytes Absolute: 0.5 x10E3/uL (ref 0.1–0.9)
Monocytes: 7 %
Neutrophils Absolute: 3.9 x10E3/uL (ref 1.4–7.0)
Neutrophils: 59 %
Platelets: 399 x10E3/uL (ref 150–450)
RBC: 4.8 x10E6/uL (ref 3.77–5.28)
RDW: 13 % (ref 11.7–15.4)
WBC: 6.8 x10E3/uL (ref 3.4–10.8)

## 2023-12-27 LAB — LIPID PANEL
Cholesterol, Total: 220 mg/dL — AB (ref 100–199)
HDL: 55 mg/dL (ref >39)
LDL CALC COMMENT:: 4 ratio (ref 0.0–4.4)
LDL Chol Calc (NIH): 136 mg/dL — AB (ref 0–99)
Triglycerides: 162 mg/dL — AB (ref 0–149)
VLDL Cholesterol Cal: 29 mg/dL (ref 5–40)

## 2023-12-27 LAB — CMP14+EGFR
ALT: 15 IU/L (ref 0–32)
AST: 15 IU/L (ref 0–40)
Albumin: 4.6 g/dL (ref 3.9–4.9)
Alkaline Phosphatase: 75 IU/L (ref 44–121)
BUN/Creatinine Ratio: 15 (ref 9–23)
BUN: 11 mg/dL (ref 6–24)
Bilirubin Total: 0.3 mg/dL (ref 0.0–1.2)
CO2: 21 mmol/L (ref 20–29)
Calcium: 9.1 mg/dL (ref 8.7–10.2)
Chloride: 104 mmol/L (ref 96–106)
Creatinine, Ser: 0.73 mg/dL (ref 0.57–1.00)
Globulin, Total: 2.6 g/dL (ref 1.5–4.5)
Glucose: 91 mg/dL (ref 70–99)
Potassium: 4.5 mmol/L (ref 3.5–5.2)
Sodium: 138 mmol/L (ref 134–144)
Total Protein: 7.2 g/dL (ref 6.0–8.5)
eGFR: 105 mL/min/1.73 (ref >59)

## 2024-01-03 LAB — CYTOLOGY - PAP
Adequacy: ABSENT
Comment: NEGATIVE
Diagnosis: NEGATIVE
Diagnosis: REACTIVE
High risk HPV: NEGATIVE

## 2024-01-04 ENCOUNTER — Ambulatory Visit: Payer: Self-pay | Admitting: Family Medicine

## 2024-03-04 ENCOUNTER — Ambulatory Visit: Payer: Self-pay

## 2024-03-04 NOTE — Telephone Encounter (Signed)
 Reason for Disposition . [1] MODERATE pain (e.g., interferes with normal activities, limping) AND [2] present > 3 days  Protocols used: Leg Pain-A-AH

## 2024-03-04 NOTE — Telephone Encounter (Signed)
Noted  -LS

## 2024-03-04 NOTE — Telephone Encounter (Addendum)
 FYI Only or Action Required?: FYI only for provider: appointment scheduled on 03/05/2024.  Patient was last seen in primary care on 12/26/2023 by Joesph Annabella HERO, FNP.  Called Nurse Triage reporting Pain.  Symptoms began a week ago.  Interventions attempted: Nothing.  Symptoms are: gradually worsening.  Triage Disposition: See PCP When Office is Open (Within 3 Days)  Patient/caregiver understands and will follow disposition?: Yes    Copied from CRM #[id number]. Topic: Clinical - Red Word Triage >> Mar 04, 2024  3:46 PM China J wrote: [facility] that prompted transfer to Nurse Triage: Patient is having knee pain for the past 2 months now. When she bends down, it is hard for her to get up. Answer Assessment - Initial Assessment Questions 1. ONSET: When did the pain start?      2 months and worsening x week 2. LOCATION: Where is the pain located?      Bilateral knee pain - sometimes radiates into thighs when working  3. PAIN: How bad is the pain?    (Scale 1-10; or mild, moderate, severe)     7 or 8/10 4. WORK OR EXERCISE: Has there been any recent work or exercise that involved this part of the body?      Started back exercising 5. CAUSE: What do you think is causing the leg pain?     unknown 6. OTHER SYMPTOMS: Do you have any other symptoms? (e.g., chest pain, back pain, breathing difficulty, swelling, rash, fever, numbness, weakness)     Swelling, numbness at times 7. PREGNANCY: Is there any chance you are pregnant? When was your last menstrual period?     no  When sit for a while then start moving bones start cracking and pt states gained a lot of weight.   Taking ibuprofen without relief  Protocols used: Leg Pain-A-AH

## 2024-03-05 ENCOUNTER — Ambulatory Visit

## 2024-03-05 ENCOUNTER — Ambulatory Visit (INDEPENDENT_AMBULATORY_CARE_PROVIDER_SITE_OTHER): Admitting: Family Medicine

## 2024-03-05 ENCOUNTER — Encounter: Payer: Self-pay | Admitting: Family Medicine

## 2024-03-05 VITALS — BP 126/76 | HR 92 | Temp 98.9°F | Ht 67.0 in | Wt 196.8 lb

## 2024-03-05 DIAGNOSIS — E66811 Obesity, class 1: Secondary | ICD-10-CM

## 2024-03-05 DIAGNOSIS — M25562 Pain in left knee: Secondary | ICD-10-CM

## 2024-03-05 DIAGNOSIS — M1711 Unilateral primary osteoarthritis, right knee: Secondary | ICD-10-CM | POA: Diagnosis not present

## 2024-03-05 DIAGNOSIS — G8929 Other chronic pain: Secondary | ICD-10-CM | POA: Diagnosis not present

## 2024-03-05 DIAGNOSIS — M25561 Pain in right knee: Secondary | ICD-10-CM

## 2024-03-05 DIAGNOSIS — E6609 Other obesity due to excess calories: Secondary | ICD-10-CM | POA: Diagnosis not present

## 2024-03-05 DIAGNOSIS — M25461 Effusion, right knee: Secondary | ICD-10-CM | POA: Diagnosis not present

## 2024-03-05 DIAGNOSIS — Z6830 Body mass index (BMI) 30.0-30.9, adult: Secondary | ICD-10-CM

## 2024-03-05 MED ORDER — LIDOCAINE 5 % EX OINT: 1.0000 | TOPICAL_OINTMENT | CUTANEOUS | 0 refills | Status: AC | PRN

## 2024-03-05 MED ORDER — MELOXICAM 15 MG PO TABS: 15.0000 mg | ORAL_TABLET | Freq: Every day | ORAL | 0 refills | Status: AC

## 2024-03-05 NOTE — Progress Notes (Signed)
 Acute Office Visit  Subjective:     Patient ID: Jordan Berger, female    DOB: 1980-07-08, 43 y.o.   MRN: [id number]  Chief Complaint  Patient presents with   bilateral knee pain    HPI  History of Present Illness   Jordan Berger is a 43 year old female who presents with bilateral knee pain for two months.  Bilateral knee pain - Bilateral knee pain for approximately two months, worsening over the past week - Pain exacerbated by playing badminton and prolonged standing during 12-hour work shifts - Difficulty rising from a seated position, requiring rolling and pushing herself up at times - Pain described as 'all over' both knees, with no significant swelling - Frequent popping sounds in the knees, especially after remaining in one position for an extended period - Cramps extending down from the hips - Sometimes has a catch when walking or turning, more pronounced in the left knee - Unable to squat or push herself back up due to pain - No significant relief with ibuprofen, bone supplements, knee wrap, or over-the-counter patches - Symptoms worsen with physical activity and prolonged standing - Knee pain hinders efforts to exercise for weight loss - No injury  Menstrual history - Last menstrual cycle was last week  Body habitus - Currently overweight and attempting to exercise to lose weight, but limited by knee pain       ROS As per HPI.      Objective:    BP 126/76   Pulse 92   Temp 98.9 F (37.2 C)   Ht 5' 7 (1.702 m)   Wt 196 lb 12.8 oz (89.3 kg)   SpO2 99%   BMI 30.82 kg/m  Wt Readings from Last 3 Encounters:  03/05/24 196 lb 12.8 oz (89.3 kg)  12/26/23 199 lb (90.3 kg)  11/22/22 177 lb (80.3 kg)      Physical Exam Vitals and nursing note reviewed.  Constitutional:      General: She is not in acute distress.    Appearance: She is obese. She is not ill-appearing, toxic-appearing or diaphoretic.  Pulmonary:     Effort: Pulmonary  effort is normal. No respiratory distress.  Musculoskeletal:     Right knee: Crepitus present. No swelling, effusion or bony tenderness. Normal range of motion. No tenderness. Normal alignment.     Instability Tests: Medial McMurray test negative and lateral McMurray test negative.     Left knee: Crepitus present. No swelling, effusion or bony tenderness. Normal range of motion. Tenderness present over the patellar tendon. Normal alignment.     Instability Tests: Medial McMurray test negative and lateral McMurray test negative.     Right lower leg: No edema.     Left lower leg: No edema.  Neurological:     General: No focal deficit present.     Mental Status: She is alert and oriented to person, place, and time.  Psychiatric:        Mood and Affect: Mood normal.        Behavior: Behavior normal.     No results found for any visits on 03/05/24.      Assessment & Plan:   Jordan Berger was seen today for bilateral knee pain.  Diagnoses and all orders for this visit:  Chronic pain of both knees -     lidocaine (XYLOCAINE) 5 % ointment; Apply 1 Application topically as needed. -     meloxicam (MOBIC) 15 MG tablet; Take 1 tablet (  15 mg total) by mouth daily. -     DG Knee 1-2 Views Right; Future -     DG Knee 1-2 Views Left; Future  Class 1 obesity due to excess calories without serious comorbidity with body mass index (BMI) of 30.0 to 30.9 in adult  Assessment and Plan    Bilateral knee pain Chronic bilateral knee pain for two months, worsening over the past week. Ordered x-rays of both knees to assess for arthritis or other structural abnormalities. - Prescribed meloxicam once daily to reduce inflammation and pain. - Advised against taking ibuprofen or other NSAIDs while on meloxicam. - Recommended Tylenol for additional pain relief as needed. - Continue using a supportive knee brace for support during physical activity. - Prescribed lidocaine 5% ointment for topical pain  relief. - Will consider referral to physical therapy or orthopedic specialist based on x-ray results.      Obesity Weight has been trending down. Continue physical activity, well balanced diet.   Return to office for new or worsening symptoms, or if symptoms persist.   The patient indicates understanding of these issues and agrees with the plan.  Jordan CHRISTELLA Search, FNP

## 2024-03-12 ENCOUNTER — Ambulatory Visit: Payer: Self-pay | Admitting: Family Medicine

## 2024-03-19 ENCOUNTER — Telehealth: Payer: Self-pay | Admitting: Family Medicine

## 2024-03-19 NOTE — Telephone Encounter (Signed)
 2nd attempt to contact patient to give xray results. Left another message for patient to call back.

## 2024-03-19 NOTE — Telephone Encounter (Signed)
Pt would like xray results.
